# Patient Record
Sex: Male | Born: 1940 | Race: White | Hispanic: No | Marital: Single | Smoking: Current every day smoker
Health system: Southern US, Community
[De-identification: ages and names within clinical notes are randomized; demographics above are authoritative.]

## PROBLEM LIST (undated history)

## (undated) DIAGNOSIS — R29898 Other symptoms and signs involving the musculoskeletal system: Secondary | ICD-10-CM

## (undated) DIAGNOSIS — G629 Polyneuropathy, unspecified: Secondary | ICD-10-CM

## (undated) DIAGNOSIS — Z9852 Vasectomy status: Secondary | ICD-10-CM

## (undated) DIAGNOSIS — K449 Diaphragmatic hernia without obstruction or gangrene: Secondary | ICD-10-CM

## (undated) DIAGNOSIS — N179 Acute kidney failure, unspecified: Secondary | ICD-10-CM

## (undated) DIAGNOSIS — E46 Unspecified protein-calorie malnutrition: Secondary | ICD-10-CM

## (undated) DIAGNOSIS — I469 Cardiac arrest, cause unspecified: Secondary | ICD-10-CM

## (undated) DIAGNOSIS — R1310 Dysphagia, unspecified: Secondary | ICD-10-CM

## (undated) DIAGNOSIS — G931 Anoxic brain damage, not elsewhere classified: Secondary | ICD-10-CM

## (undated) DIAGNOSIS — R351 Nocturia: Secondary | ICD-10-CM

## (undated) DIAGNOSIS — J9690 Respiratory failure, unspecified, unspecified whether with hypoxia or hypercapnia: Secondary | ICD-10-CM

## (undated) DIAGNOSIS — Z8709 Personal history of other diseases of the respiratory system: Secondary | ICD-10-CM

## (undated) DIAGNOSIS — G4700 Insomnia, unspecified: Secondary | ICD-10-CM

## (undated) DIAGNOSIS — J189 Pneumonia, unspecified organism: Secondary | ICD-10-CM

## (undated) DIAGNOSIS — J439 Emphysema, unspecified: Secondary | ICD-10-CM

## (undated) DIAGNOSIS — Z8719 Personal history of other diseases of the digestive system: Secondary | ICD-10-CM

## (undated) DIAGNOSIS — I10 Essential (primary) hypertension: Secondary | ICD-10-CM

## (undated) DIAGNOSIS — R3911 Hesitancy of micturition: Secondary | ICD-10-CM

## (undated) HISTORY — PX: TONSILLECTOMY AND ADENOIDECTOMY: SUR1326

## (undated) HISTORY — PX: APPENDECTOMY: SHX54

## (undated) HISTORY — PX: COLONOSCOPY W/ BIOPSIES AND POLYPECTOMY: SHX1376

## (undated) HISTORY — PX: KNEE ARTHROSCOPY: SUR90

---

## 2014-04-29 HISTORY — PX: ESOPHAGEAL DILATION: SHX303

## 2016-12-26 ENCOUNTER — Inpatient Hospital Stay
Admission: AD | Admit: 2016-12-26 | Discharge: 2017-01-08 | Disposition: A | Discharge status: Rehabilitation Facility | Payer: Self-pay | Source: Ambulatory Visit | Attending: Internal Medicine | Admitting: Internal Medicine

## 2016-12-26 ENCOUNTER — Other Ambulatory Visit (HOSPITAL_COMMUNITY): Payer: Self-pay

## 2016-12-26 DIAGNOSIS — R4702 Dysphasia: Secondary | ICD-10-CM

## 2016-12-26 DIAGNOSIS — J189 Pneumonia, unspecified organism: Secondary | ICD-10-CM

## 2016-12-26 DIAGNOSIS — Z4659 Encounter for fitting and adjustment of other gastrointestinal appliance and device: Secondary | ICD-10-CM

## 2016-12-26 HISTORY — DX: Hesitancy of micturition: R39.11

## 2016-12-26 HISTORY — DX: Polyneuropathy, unspecified: G62.9

## 2016-12-26 HISTORY — DX: Essential (primary) hypertension: I10

## 2016-12-26 HISTORY — DX: Diaphragmatic hernia without obstruction or gangrene: K44.9

## 2016-12-26 HISTORY — DX: Other symptoms and signs involving the musculoskeletal system: R29.898

## 2016-12-26 HISTORY — DX: Personal history of other diseases of the digestive system: Z87.19

## 2016-12-26 HISTORY — DX: Nocturia: R35.1

## 2016-12-27 LAB — COMPREHENSIVE METABOLIC PANEL
ALBUMIN: 2.3 g/dL — AB (ref 3.5–5.0)
ALK PHOS: 94 U/L (ref 38–126)
ALT: 30 U/L (ref 17–63)
ANION GAP: 10 (ref 5–15)
AST: 29 U/L (ref 15–41)
BUN: 31 mg/dL — AB (ref 6–20)
CALCIUM: 8.7 mg/dL — AB (ref 8.9–10.3)
CO2: 29 mmol/L (ref 22–32)
CREATININE: 1.18 mg/dL (ref 0.61–1.24)
Chloride: 106 mmol/L (ref 101–111)
GFR calc Af Amer: >60 mL/min (ref >60)
GFR calc non Af Amer: 59 mL/min — ABNORMAL LOW (ref >60)
GLUCOSE: 146 mg/dL — AB (ref 65–99)
Potassium: 2.4 mmol/L — CL (ref 3.5–5.1)
SODIUM: 145 mmol/L (ref 135–145)
Total Bilirubin: 0.4 mg/dL (ref 0.3–1.2)
Total Protein: 6.2 g/dL — ABNORMAL LOW (ref 6.5–8.1)

## 2016-12-27 LAB — CBC WITH DIFFERENTIAL/PLATELET
BASOS PCT: 0 %
Basophils Absolute: 0 10*3/uL (ref 0.0–0.1)
EOS PCT: 4 %
Eosinophils Absolute: 0.4 10*3/uL (ref 0.0–0.7)
HEMATOCRIT: 25.2 % — AB (ref 39.0–52.0)
Hemoglobin: 7.9 g/dL — ABNORMAL LOW (ref 13.0–17.0)
Lymphocytes Relative: 13 %
Lymphs Abs: 1.2 10*3/uL (ref 0.7–4.0)
MCH: 30 pg (ref 26.0–34.0)
MCHC: 31.3 g/dL (ref 30.0–36.0)
MCV: 95.8 fL (ref 78.0–100.0)
MONO ABS: 0.9 10*3/uL (ref 0.1–1.0)
MONOS PCT: 9 %
NEUTROS ABS: 7.2 10*3/uL (ref 1.7–7.7)
Neutrophils Relative %: 74 %
PLATELETS: 540 10*3/uL — AB (ref 150–400)
RBC: 2.63 MIL/uL — ABNORMAL LOW (ref 4.22–5.81)
RDW: 14.3 % (ref 11.5–15.5)
WBC: 9.7 10*3/uL (ref 4.0–10.5)

## 2016-12-28 LAB — BASIC METABOLIC PANEL
ANION GAP: 10 (ref 5–15)
Anion gap: 9 (ref 5–15)
BUN: 40 mg/dL — ABNORMAL HIGH (ref 6–20)
BUN: 40 mg/dL — ABNORMAL HIGH (ref 6–20)
CALCIUM: 8.6 mg/dL — AB (ref 8.9–10.3)
CALCIUM: 8.8 mg/dL — AB (ref 8.9–10.3)
CO2: 30 mmol/L (ref 22–32)
CO2: 30 mmol/L (ref 22–32)
Chloride: 110 mmol/L (ref 101–111)
Chloride: 111 mmol/L (ref 101–111)
Creatinine, Ser: 1.48 mg/dL — ABNORMAL HIGH (ref 0.61–1.24)
Creatinine, Ser: 1.6 mg/dL — ABNORMAL HIGH (ref 0.61–1.24)
GFR, EST AFRICAN AMERICAN: 52 mL/min — AB (ref >60)
GFR, EST AFRICAN AMERICAN: 48 mL/min — AB (ref >60)
GFR, EST NON AFRICAN AMERICAN: 45 mL/min — AB (ref >60)
GFR, EST NON AFRICAN AMERICAN: 41 mL/min — AB (ref >60)
Glucose, Bld: 135 mg/dL — ABNORMAL HIGH (ref 65–99)
Glucose, Bld: 152 mg/dL — ABNORMAL HIGH (ref 65–99)
Potassium: 3.2 mmol/L — ABNORMAL LOW (ref 3.5–5.1)
Potassium: 3.7 mmol/L (ref 3.5–5.1)
Sodium: 150 mmol/L — ABNORMAL HIGH (ref 135–145)
Sodium: 150 mmol/L — ABNORMAL HIGH (ref 135–145)

## 2016-12-29 LAB — BASIC METABOLIC PANEL
Anion gap: 11 (ref 5–15)
BUN: 43 mg/dL — AB (ref 6–20)
CALCIUM: 8.6 mg/dL — AB (ref 8.9–10.3)
CO2: 31 mmol/L (ref 22–32)
CREATININE: 1.73 mg/dL — AB (ref 0.61–1.24)
Chloride: 112 mmol/L — ABNORMAL HIGH (ref 101–111)
GFR calc Af Amer: 43 mL/min — ABNORMAL LOW (ref >60)
GFR, EST NON AFRICAN AMERICAN: 37 mL/min — AB (ref >60)
GLUCOSE: 109 mg/dL — AB (ref 65–99)
Potassium: 4 mmol/L (ref 3.5–5.1)
Sodium: 154 mmol/L — ABNORMAL HIGH (ref 135–145)

## 2016-12-29 LAB — POTASSIUM: Potassium: 3.5 mmol/L (ref 3.5–5.1)

## 2016-12-30 LAB — BASIC METABOLIC PANEL
Anion gap: 9 (ref 5–15)
BUN: 39 mg/dL — ABNORMAL HIGH (ref 6–20)
CALCIUM: 8.4 mg/dL — AB (ref 8.9–10.3)
CO2: 30 mmol/L (ref 22–32)
Chloride: 111 mmol/L (ref 101–111)
Creatinine, Ser: 1.67 mg/dL — ABNORMAL HIGH (ref 0.61–1.24)
GFR, EST AFRICAN AMERICAN: 45 mL/min — AB (ref >60)
GFR, EST NON AFRICAN AMERICAN: 39 mL/min — AB (ref >60)
GLUCOSE: 138 mg/dL — AB (ref 65–99)
POTASSIUM: 3.6 mmol/L (ref 3.5–5.1)
Sodium: 150 mmol/L — ABNORMAL HIGH (ref 135–145)

## 2016-12-31 LAB — BASIC METABOLIC PANEL
ANION GAP: 7 (ref 5–15)
BUN: 28 mg/dL — AB (ref 6–20)
CHLORIDE: 103 mmol/L (ref 101–111)
CO2: 30 mmol/L (ref 22–32)
Calcium: 8.2 mg/dL — ABNORMAL LOW (ref 8.9–10.3)
Creatinine, Ser: 1.3 mg/dL — ABNORMAL HIGH (ref 0.61–1.24)
GFR calc Af Amer: >60 mL/min (ref >60)
GFR calc non Af Amer: 53 mL/min — ABNORMAL LOW (ref >60)
Glucose, Bld: 136 mg/dL — ABNORMAL HIGH (ref 65–99)
POTASSIUM: 2.9 mmol/L — AB (ref 3.5–5.1)
SODIUM: 140 mmol/L (ref 135–145)

## 2016-12-31 LAB — C DIFFICILE QUICK SCREEN W PCR REFLEX
C DIFFICILE (CDIFF) INTERP: NOT DETECTED
C DIFFICILE (CDIFF) TOXIN: NEGATIVE
C DIFFICLE (CDIFF) ANTIGEN: NEGATIVE

## 2017-01-01 LAB — POTASSIUM: Potassium: 3.3 mmol/L — ABNORMAL LOW (ref 3.5–5.1)

## 2017-01-02 ENCOUNTER — Other Ambulatory Visit (HOSPITAL_COMMUNITY): Payer: Self-pay

## 2017-01-02 LAB — POTASSIUM: POTASSIUM: 3.2 mmol/L — AB (ref 3.5–5.1)

## 2017-01-03 LAB — POTASSIUM: POTASSIUM: 3.1 mmol/L — AB (ref 3.5–5.1)

## 2017-01-04 LAB — POTASSIUM: POTASSIUM: 3.3 mmol/L — AB (ref 3.5–5.1)

## 2017-01-05 LAB — C DIFFICILE QUICK SCREEN W PCR REFLEX
C DIFFICLE (CDIFF) ANTIGEN: NEGATIVE
C Diff interpretation: NOT DETECTED
C Diff toxin: NEGATIVE

## 2017-01-05 LAB — POTASSIUM: POTASSIUM: 3.8 mmol/L (ref 3.5–5.1)

## 2017-01-08 ENCOUNTER — Encounter (HOSPITAL_COMMUNITY): Payer: Self-pay | Admitting: *Deleted

## 2017-01-08 ENCOUNTER — Encounter: Payer: Self-pay | Admitting: Physical Medicine and Rehabilitation

## 2017-01-08 ENCOUNTER — Inpatient Hospital Stay (HOSPITAL_COMMUNITY)
Admission: RE | Admit: 2017-01-08 | Discharge: 2017-01-16 | DRG: 057 | Disposition: A | Discharge status: Home Health | Payer: Medicare Other | Source: Intra-hospital | Attending: Physical Medicine & Rehabilitation | Admitting: Physical Medicine & Rehabilitation

## 2017-01-08 DIAGNOSIS — F1010 Alcohol abuse, uncomplicated: Secondary | ICD-10-CM

## 2017-01-08 DIAGNOSIS — E46 Unspecified protein-calorie malnutrition: Secondary | ICD-10-CM

## 2017-01-08 DIAGNOSIS — Z09 Encounter for follow-up examination after completed treatment for conditions other than malignant neoplasm: Secondary | ICD-10-CM

## 2017-01-08 DIAGNOSIS — R310 Gross hematuria: Secondary | ICD-10-CM | POA: Diagnosis not present

## 2017-01-08 DIAGNOSIS — N390 Urinary tract infection, site not specified: Secondary | ICD-10-CM | POA: Diagnosis not present

## 2017-01-08 DIAGNOSIS — R1310 Dysphagia, unspecified: Secondary | ICD-10-CM | POA: Diagnosis present

## 2017-01-08 DIAGNOSIS — Z79899 Other long term (current) drug therapy: Secondary | ICD-10-CM

## 2017-01-08 DIAGNOSIS — I10 Essential (primary) hypertension: Secondary | ICD-10-CM | POA: Diagnosis present

## 2017-01-08 DIAGNOSIS — Z91038 Other insect allergy status: Secondary | ICD-10-CM

## 2017-01-08 DIAGNOSIS — F17210 Nicotine dependence, cigarettes, uncomplicated: Secondary | ICD-10-CM | POA: Diagnosis present

## 2017-01-08 DIAGNOSIS — I69391 Dysphagia following cerebral infarction: Secondary | ICD-10-CM | POA: Diagnosis not present

## 2017-01-08 DIAGNOSIS — D638 Anemia in other chronic diseases classified elsewhere: Secondary | ICD-10-CM

## 2017-01-08 DIAGNOSIS — S3730XA Unspecified injury of urethra, initial encounter: Secondary | ICD-10-CM | POA: Diagnosis not present

## 2017-01-08 DIAGNOSIS — N179 Acute kidney failure, unspecified: Secondary | ICD-10-CM | POA: Diagnosis present

## 2017-01-08 DIAGNOSIS — R5381 Other malaise: Secondary | ICD-10-CM | POA: Diagnosis present

## 2017-01-08 DIAGNOSIS — J869 Pyothorax without fistula: Secondary | ICD-10-CM

## 2017-01-08 DIAGNOSIS — X58XXXA Exposure to other specified factors, initial encounter: Secondary | ICD-10-CM | POA: Diagnosis present

## 2017-01-08 DIAGNOSIS — G4700 Insomnia, unspecified: Secondary | ICD-10-CM | POA: Diagnosis present

## 2017-01-08 DIAGNOSIS — R339 Retention of urine, unspecified: Secondary | ICD-10-CM | POA: Diagnosis present

## 2017-01-08 DIAGNOSIS — G629 Polyneuropathy, unspecified: Secondary | ICD-10-CM | POA: Diagnosis present

## 2017-01-08 DIAGNOSIS — Z1621 Resistance to vancomycin: Secondary | ICD-10-CM | POA: Diagnosis present

## 2017-01-08 DIAGNOSIS — I6389 Other cerebral infarction: Secondary | ICD-10-CM | POA: Diagnosis present

## 2017-01-08 DIAGNOSIS — Z720 Tobacco use: Secondary | ICD-10-CM | POA: Diagnosis not present

## 2017-01-08 DIAGNOSIS — J90 Pleural effusion, not elsewhere classified: Secondary | ICD-10-CM | POA: Diagnosis present

## 2017-01-08 DIAGNOSIS — I638 Other cerebral infarction: Secondary | ICD-10-CM

## 2017-01-08 DIAGNOSIS — R319 Hematuria, unspecified: Secondary | ICD-10-CM | POA: Diagnosis present

## 2017-01-08 DIAGNOSIS — E440 Moderate protein-calorie malnutrition: Secondary | ICD-10-CM | POA: Diagnosis present

## 2017-01-08 DIAGNOSIS — I69351 Hemiplegia and hemiparesis following cerebral infarction affecting right dominant side: Secondary | ICD-10-CM | POA: Diagnosis not present

## 2017-01-08 DIAGNOSIS — Z8674 Personal history of sudden cardiac arrest: Secondary | ICD-10-CM

## 2017-01-08 DIAGNOSIS — K449 Diaphragmatic hernia without obstruction or gangrene: Secondary | ICD-10-CM | POA: Diagnosis present

## 2017-01-08 DIAGNOSIS — E8809 Other disorders of plasma-protein metabolism, not elsewhere classified: Secondary | ICD-10-CM

## 2017-01-08 DIAGNOSIS — Z91030 Bee allergy status: Secondary | ICD-10-CM | POA: Diagnosis not present

## 2017-01-08 DIAGNOSIS — B952 Enterococcus as the cause of diseases classified elsewhere: Secondary | ICD-10-CM | POA: Diagnosis not present

## 2017-01-08 DIAGNOSIS — Z6822 Body mass index (BMI) 22.0-22.9, adult: Secondary | ICD-10-CM

## 2017-01-08 HISTORY — DX: Dysphagia, unspecified: R13.10

## 2017-01-08 HISTORY — DX: Respiratory failure, unspecified, unspecified whether with hypoxia or hypercapnia: J96.90

## 2017-01-08 HISTORY — DX: Personal history of other diseases of the respiratory system: Z87.09

## 2017-01-08 HISTORY — DX: Anoxic brain damage, not elsewhere classified: G93.1

## 2017-01-08 HISTORY — DX: Emphysema, unspecified: J43.9

## 2017-01-08 HISTORY — DX: Cardiac arrest, cause unspecified: I46.9

## 2017-01-08 HISTORY — DX: Pneumonia, unspecified organism: J18.9

## 2017-01-08 HISTORY — DX: Insomnia, unspecified: G47.00

## 2017-01-08 HISTORY — DX: Unspecified protein-calorie malnutrition: E46

## 2017-01-08 HISTORY — DX: Vasectomy status: Z98.52

## 2017-01-08 HISTORY — DX: Acute kidney failure, unspecified: N17.9

## 2017-01-08 LAB — URINALYSIS, COMPLETE (UACMP) WITH MICROSCOPIC
Bilirubin Urine: NEGATIVE
Glucose, UA: NEGATIVE mg/dL
Ketones, ur: NEGATIVE mg/dL
LEUKOCYTES UA: NEGATIVE
NITRITE: NEGATIVE
Protein, ur: 30 mg/dL — AB
SPECIFIC GRAVITY, URINE: 1.014 (ref 1.005–1.030)
SQUAMOUS EPITHELIAL / LPF: NONE SEEN
pH: 5 (ref 5.0–8.0)

## 2017-01-08 MED ORDER — GABAPENTIN 100 MG PO CAPS
100.0000 mg | ORAL_CAPSULE | Freq: Every day | ORAL | Status: DC
  Administered 2017-01-08 – 2017-01-15 (×8): 100 mg via ORAL
  Filled 2017-01-08 (×8): qty 1

## 2017-01-08 MED ORDER — POLYETHYLENE GLYCOL 3350 17 G PO PACK: 17.0000 g | PACK | Freq: Every day | ORAL | Status: DC | PRN

## 2017-01-08 MED ORDER — PROCHLORPERAZINE 25 MG RE SUPP: 12.5000 mg | Freq: Four times a day (QID) | RECTAL | Status: DC | PRN

## 2017-01-08 MED ORDER — TRAZODONE HCL 50 MG PO TABS
25.0000 mg | ORAL_TABLET | Freq: Every evening | ORAL | Status: DC | PRN
  Administered 2017-01-08: 50 mg via ORAL
  Filled 2017-01-08: qty 1

## 2017-01-08 MED ORDER — ALUM & MAG HYDROXIDE-SIMETH 200-200-20 MG/5ML PO SUSP: 30.0000 mL | ORAL | Status: DC | PRN

## 2017-01-08 MED ORDER — TAMSULOSIN HCL 0.4 MG PO CAPS
0.4000 mg | ORAL_CAPSULE | Freq: Every day | ORAL | Status: DC
  Administered 2017-01-08 – 2017-01-15 (×8): 0.4 mg via ORAL
  Filled 2017-01-08 (×8): qty 1

## 2017-01-08 MED ORDER — FOLIC ACID 1 MG PO TABS
1.0000 mg | ORAL_TABLET | Freq: Every day | ORAL | Status: DC
  Administered 2017-01-09 – 2017-01-16 (×8): 1 mg via ORAL
  Filled 2017-01-08 (×8): qty 1

## 2017-01-08 MED ORDER — PROCHLORPERAZINE MALEATE 5 MG PO TABS: 5.0000 mg | ORAL_TABLET | Freq: Four times a day (QID) | ORAL | Status: DC | PRN

## 2017-01-08 MED ORDER — PANTOPRAZOLE SODIUM 40 MG PO TBEC
40.0000 mg | DELAYED_RELEASE_TABLET | Freq: Every day | ORAL | Status: DC
  Administered 2017-01-09 – 2017-01-16 (×8): 40 mg via ORAL
  Filled 2017-01-08 (×8): qty 1

## 2017-01-08 MED ORDER — FUROSEMIDE 40 MG PO TABS
40.0000 mg | ORAL_TABLET | Freq: Every day | ORAL | Status: DC
  Administered 2017-01-09 – 2017-01-10 (×2): 40 mg via ORAL
  Filled 2017-01-08 (×2): qty 1

## 2017-01-08 MED ORDER — ALBUTEROL SULFATE (2.5 MG/3ML) 0.083% IN NEBU: 2.5000 mg | INHALATION_SOLUTION | RESPIRATORY_TRACT | Status: DC | PRN

## 2017-01-08 MED ORDER — BISACODYL 10 MG RE SUPP: 10.0000 mg | Freq: Every day | RECTAL | Status: DC | PRN

## 2017-01-08 MED ORDER — FLEET ENEMA 7-19 GM/118ML RE ENEM: 1.0000 | ENEMA | Freq: Once | RECTAL | Status: DC | PRN

## 2017-01-08 MED ORDER — METOPROLOL TARTRATE 50 MG PO TABS
50.0000 mg | ORAL_TABLET | Freq: Two times a day (BID) | ORAL | Status: DC
  Administered 2017-01-08 – 2017-01-10 (×4): 50 mg via ORAL
  Filled 2017-01-08 (×4): qty 1

## 2017-01-08 MED ORDER — GUAIFENESIN-DM 100-10 MG/5ML PO SYRP: 5.0000 mL | ORAL_SOLUTION | Freq: Four times a day (QID) | ORAL | Status: DC | PRN

## 2017-01-08 MED ORDER — NICOTINE 14 MG/24HR TD PT24
14.0000 mg | MEDICATED_PATCH | Freq: Every day | TRANSDERMAL | Status: DC
  Administered 2017-01-09 – 2017-01-16 (×8): 14 mg via TRANSDERMAL
  Filled 2017-01-08 (×8): qty 1

## 2017-01-08 MED ORDER — ENOXAPARIN SODIUM 40 MG/0.4ML ~~LOC~~ SOLN
40.0000 mg | SUBCUTANEOUS | Status: DC
  Administered 2017-01-08 – 2017-01-15 (×8): 40 mg via SUBCUTANEOUS
  Filled 2017-01-08 (×8): qty 0.4

## 2017-01-08 MED ORDER — PROCHLORPERAZINE EDISYLATE 5 MG/ML IJ SOLN: 5.0000 mg | Freq: Four times a day (QID) | INTRAMUSCULAR | Status: DC | PRN

## 2017-01-08 MED ORDER — LIDOCAINE HCL 2 % EX GEL
1.0000 "application " | CUTANEOUS | Status: DC | PRN
  Filled 2017-01-08 (×2): qty 5

## 2017-01-08 MED ORDER — ACETAMINOPHEN 325 MG PO TABS
325.0000 mg | ORAL_TABLET | ORAL | Status: DC | PRN
  Administered 2017-01-08 – 2017-01-16 (×11): 650 mg via ORAL
  Filled 2017-01-08 (×12): qty 2

## 2017-01-08 MED ORDER — DIPHENHYDRAMINE HCL 12.5 MG/5ML PO ELIX: 12.5000 mg | ORAL_SOLUTION | Freq: Four times a day (QID) | ORAL | Status: DC | PRN

## 2017-01-08 MED ORDER — POTASSIUM CHLORIDE CRYS ER 20 MEQ PO TBCR
20.0000 meq | EXTENDED_RELEASE_TABLET | Freq: Every day | ORAL | Status: DC
  Administered 2017-01-09 – 2017-01-16 (×8): 20 meq via ORAL
  Filled 2017-01-08 (×8): qty 1

## 2017-01-08 MED ORDER — VITAMIN B-1 100 MG PO TABS
100.0000 mg | ORAL_TABLET | Freq: Every day | ORAL | Status: DC
  Administered 2017-01-09 – 2017-01-16 (×8): 100 mg via ORAL
  Filled 2017-01-08 (×8): qty 1

## 2017-01-08 NOTE — Progress Notes (Signed)
Pt arrived to room 4w09 accompanied by daughter. Oriented x 4, VSS, denies pain.  Oriented to room and rehab process/ Report to night shift RN.

## 2017-01-08 NOTE — H&P (Signed)
Physical Medicine and Rehabilitation Admission H&P    CC: Debility    HPI:  Jackson Howell is a 76 year old male with history of HTN, peripheral neuropathy, dysphagia s/p esophageal dilatation, ETOH abuse, recent PNA who was admitted to [facility] on 12/08/16 with low grade fever/chills, fatigue, multiple episodes of emesis and SOB due to empyema. Hospital course there complicated by septic shock with PEA arrest 12/10/16 with CPR X 17 minutes. Chest tube placed with drainage of 2 L of purulent material and he was treated with Zosyn thorough 9/8. Noted to have severe dysphagia with NPO, aphasia and lethargy/encephalopathic--EEG negative for seizures. MRI brai n done showing subacute infarct left corona radiata and possible right caudate nucleus.  He was transferred to [facility] on 12/26/16 and has been making steady progress. Lethargy has resolved, respiratory status improved and has been started on dysphagia 2, nectar liquids. Esophagogram negative for stricture and no aspiration noted. Foley placed due to urinary retention.  He continues to be debilitated with decline in mobility and ability to carryout ADL tasks. CIR recommended by rehab team.    Review of Systems  Constitutional: Negative for chills and fever.  HENT: Negative for hearing loss and tinnitus.   Eyes: Negative for blurred vision and double vision.  Respiratory: Negative for cough and shortness of breath.   Cardiovascular: Negative for chest pain and palpitations.  Gastrointestinal: Negative for abdominal pain, heartburn and nausea.  Genitourinary: Positive for frequency and urgency. Negative for dysuria.  Musculoskeletal: Negative for joint pain and myalgias.  Skin: Negative for itching and rash.  Neurological: Positive for sensory change (bilateral feet) and weakness.  Psychiatric/Behavioral: Negative for depression. The patient is not nervous/anxious.   All other systems reviewed and are negative.   Past Medical  History:  Diagnosis Date  . Hiatal hernia   . History of esophageal stricture    s/p dilatation X 2  . HTN (hypertension)   . Peripheral neuropathy    bilateral feet  . RUE weakness      Past Surgical History:  Procedure Laterality Date  . APPENDECTOMY    . COLONOSCOPY W/ BIOPSIES AND POLYPECTOMY    . ESOPHAGEAL DILATION  2016  . KNEE ARTHROSCOPY Left   . TONSILLECTOMY AND ADENOIDECTOMY       Family History  Problem Relation Age of Onset  . Diabetes Mother   . ALS Father   . Prostate cancer Brother      Social History:  [place]ives in [place]--has daughter in [place]. Divorced but sees wife daily. Used to play tennis but has been sedentary for past 10 years.  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 he has been smoking 2 PPD. Drinks couple of glasses of wine and 2 shots of liquor daily. Does not use any illicit drugs.      Allergies  Allergen Reactions  . Bee Venom   . Wasp Venom      Home Medications: Gabapentin 600 mg bid. Losartan Amlodipine.    Drug Regimen Review  Drug regimen was reviewed and remains appropriate with no significant issues identified  Home: Home Living Living Arrangements: Alone Available Help at Discharge: Family, Available PRN/intermittently Type of Home: House Home Access: Stairs to enter [employer] of Steps: 22 steps at back entrance Home Layout: Two level, Laundry or work area in basement, Able to live on main level with bedroom/bathroom Alternate Level Stairs-Number of Steps: 22 steps  Lives With: Alone   Functional History: Prior  Function Level of Independence: Independent  Functional Status:  Mobility: Min assist for transfers. CGA to ambulate 25' with RW     ADL:    Cognition:        Height 6' (1.829 m), weight 75.8 kg (167 lb 1.7 oz). Physical Exam  Nursing note and vitals reviewed. Constitutional: He is oriented to person, place, and time. He appears well-developed and well-nourished.  HENT:   Head: Normocephalic and atraumatic.  Mouth/Throat: Oropharynx is clear and moist.  Eyes: Pupils are equal, round, and reactive to light. Conjunctivae and EOM are normal.  Neck: Normal range of motion. Neck supple.  Cardiovascular: Normal rate and regular rhythm.   No murmur heard. Respiratory: Effort normal and breath sounds normal. No stridor. No respiratory distress. He has no wheezes.  GI: Soft. Bowel sounds are normal. He exhibits no distension. There is no tenderness.  Genitourinary:  Genitourinary Comments: Foley in place.   Musculoskeletal: He exhibits no edema or tenderness.  Right shoulder limited due to RTC pathology.   Neurological: He is alert and oriented to person, place, and time.  Motor: RUE: Shoulder abduction 3-/5, distally 4/5 (rotator cuff pathology) RLE: 4/5 proximal to distal LUE/LLE: 4+/5 proximal to distal  Skin: Skin is warm and dry.  Resolving ecchymosis bilateral forearms   Psychiatric: He has a normal mood and affect. His behavior is normal. Thought content normal.    No results found for this or any previous visit (from the past 48 hour(s)). No results found.     Medical Problem List and Plan: 1.  Decreased endurance, mobility deficits, and limitation in self-care secondary to debility.  2.  DVT Prophylaxis/Anticoagulation: Pharmaceutical: Lovenox 3. Pain Management: tylenol prn  4. Mood: LCSW to follow for evaluation and support.  5. Neuropsych: This patient is capable of making decisions on his own behalf. 6. Skin/Wound Care: Routine pressure relief measures 7. Fluids/Electrolytes/Nutrition: Monitor I/O. Used to drink boost at home. Check lytes in am.  8. HTN: Monitor BP bid--continue metoprolol bid. May need to hold lasix for now. Resume home medications as appropriate. 9. Dysphagia: On dysphagia 2, nectar liquids with poor intake due to food choices. No recent labs--question ability to maintain adequate hydration.  10. Alcohol abuse: continue  thiamine and folic acid. 11. Tobacco abuse: Continue nicotine patch.  12. Peripheral neuropathy: Will resume low dose gabapentin at bedtime to help with insomnia. Monitor for symptoms as activity increases.  13. Urinary retention: Check UA/UCS. Remove foley in am and start bladder training.  14. Code status: Discussed-- Full Code.  15. Acute kidney injury: Encourage fluid intake--may need IVF for hydration.  16. Anemia of chronic illness?: Last H/H 7.9/25.2. Recheck in am.    Post Admission Physician Evaluation: 1. Preadmission assessment reviewed and changes made below. 2. Functional deficits secondary  to debility. 3. Patient is admitted to receive collaborative, interdisciplinary care between the physiatrist, rehab nursing staff, and therapy team. 4. Patient's level of medical complexity and substantial therapy needs in context of that medical necessity cannot be provided at a lesser intensity of care such as a SNF. 5. Patient has experienced substantial functional loss from his/her baseline which was documented above under the "Functional History" and "Functional Status" headings.  Judging by the patient's diagnosis, physical exam, and functional history, the patient has potential for functional progress which will result in measurable gains while on inpatient rehab.  These gains will be of substantial and practical use upon discharge  in facilitating mobility and self-care at the  household level. 6. Physiatrist will provide 24 hour management of medical needs as well as oversight of the therapy plan/treatment and provide guidance as appropriate regarding the interaction of the two. 7. 24 hour rehab nursing will assist with bladder management, safety, disease management, medication administration and patient education and help integrate therapy concepts, techniques,education, etc. 8. PT will assess and treat for/with: Lower extremity strength, range of motion, stamina, balance, functional  mobility, safety, adaptive techniques and equipment, woundcare, coping skills, pain control, education.   Goals are: Mod I. 9. OT will assess and treat for/with: ADL's, functional mobility, safety, upper extremity strength, adaptive techniques and equipment, wound mgt, ego support, and community reintegration.   Goals are: Mod I/Supervision. Therapy may proceed with showering this patient. 10. SLP will assess and treat for/with: speech, cognition.  Goals are: Mod I. 11. Case Management and Social Worker will assess and treat for psychological issues and discharge planning. 12. Team conference will be held weekly to assess progress toward goals and to determine barriers to discharge. 13. Patient will receive at least 3 hours of therapy per day at least 5 days per week. 14. ELOS: 9-13 days.       15. Prognosis:  good   Maryla MorrowAnkit Cregg Jutte, MD, 8926 Holly DriveFAAPMR Love, Pamela S, [place]PA-C 01/08/2017

## 2017-01-08 NOTE — IPOC Note (Signed)
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 [facility])   Benign essential HTN   Dysphagia   ETOH abuse   Tobacco abuse   Peripheral polyneuropathy   Urinary retention   AKI (acute kidney injury) (HCC)   Anemia of chronic disease   Hypoalbuminemia due to protein-calorie malnutrition (HCC)   Bilateral pleural effusion     Functional Problem List: Nursing Nutrition, Endurance, Motor, Bladder  PT Balance, Endurance, Motor  OT Balance, Endurance, Motor, Safety  SLP Cognition  TR         Basic ADL's: OT Grooming, Bathing, Dressing, Toileting     Advanced  ADL's: OT Simple Meal Preparation, Laundry     Transfers: PT Bed Mobility, Bed to Chair, Car, Occupational psychologist, Research scientist (life sciences): PT Ambulation, Educational psychologist, Psychologist, prison and probation services     Additional Impairments: OT None  SLP Swallowing      TR      Anticipated Outcomes Item Anticipated Outcome  Self Feeding No goal  Swallowing  mod I    Basic self-care  Mod I  Toileting  Mod I   Bathroom Transfers Mod I  Bowel/Bladder  Patient is continent of bowel, has Foley catheter to be removed this am, anticipate patient to be continent of bowel and bladder  Transfers  mod I  Locomotion  mod I  Communication     Cognition     Pain  C/O back pain scale of 3/10, anticipate pain less than 3  Safety/Judgment  No safety issues noted, anticipate no fall or injury this admission at rehab.   Therapy Plan: PT Intensity: Minimum of 1-2 x/day ,45 to 90 minutes PT Frequency: 5 out of 7 days PT Duration Estimated Length of Stay: 9-13 days OT Intensity: Minimum of 1-2 x/day, 45 to 90 minutes OT Frequency: 5 out of 7 days OT Duration/Estimated Length of Stay: 9-13 days SLP Intensity: Minumum of 1-2 x/day,  30 to 90 minutes SLP Frequency: 3 to 5 out of 7 days SLP Duration/Estimated Length of Stay: 10-14 days     Team Interventions: Nursing Interventions Pain Management, Bladder Management, Bowel Management, Patient/Family Education, Skin Care/Wound Management, Medication Management, Dysphagia/Aspiration Precaution Training, Psychosocial Support, Discharge Planning, Disease Management/Prevention, Cognitive Remediation/Compensation  PT interventions Ambulation/gait training, Warden/ranger, Cognitive remediation/compensation, Community reintegration, Discharge planning, DME/adaptive equipment instruction, Functional mobility training, Neuromuscular re-education, Pain management, Patient/family education, Splinting/orthotics, Stair training, Therapeutic Activities, Therapeutic Exercise, UE/LE Strength taining/ROM, UE/LE Coordination activities, Wheelchair propulsion/positioning  OT Interventions Warden/ranger, Firefighter, Discharge planning, Disease mangement/prevention, Fish farm manager, Functional mobility training, Neuromuscular re-education, Pain management, Patient/family education, Psychosocial support, Self Care/advanced ADL retraining, Therapeutic Activities, Therapeutic Exercise, Visual/perceptual remediation/compensation  SLP Interventions Cueing hierarchy, Dysphagia/aspiration precaution training, Patient/family education  TR Interventions    SW/CM Interventions Discharge Planning, Psychosocial Support, Patient/Family Education   Barriers to Discharge MD  Medical stability  Nursing Decreased caregiver support, Medical stability, Home environment access/layout, Medication compliance, Nutrition means    PT Decreased caregiver support pt will be home alone  OT Lack of/limited family support    SLP      SW       Team Discharge Planning: Destination: PT-Home ,OT- Home , SLP-Home Projected Follow-up: PT-Home health PT, OT-   Outpatient OT, SLP-Other (comment) (TBD) Projected Equipment Needs: PT-To be determined, OT- Tub/shower bench,  3 in 1 bedside comode, SLP-To be determined Equipment Details: PT- , OT-  Patient/family involved in discharge planning: PT- Patient,  OT-Patient, SLP-Patient  MD ELOS: 9-13 days. Medical Rehab Prognosis:  Good Assessment: 76 year old male with history of HTN, peripheral neuropathy, dysphagia s/p esophageal dilatation, ETOH abuse, recent PNA [facility] with low grade fever/chills, fatigue, multiple episodes of emesis and SOB due to empyema. Hospital course complicated by septic shock with PEA arrest 12/10/16 with CPR X 17 minutes. Chest tube placed with drainage of 2 L of purulent material and he was treated with Zosyn thorough 9/8. Noted to have severe dysphagia with NPO, aphasia and lethargy/encephalopathic--EEG negative for seizures. MRI brai n done showing subacute infarct left corona radiata and possible right caudate nucleus.  [facility] and has been making steady progress. Lethargy has resolved, respiratory status improved and has been started on dysphagia 2, nectar liquids. Esophagogram negative for stricture and no aspiration noted. Foley placed due to urinary retention.  He continues to be debilitated with decline in mobility and ability to carryout ADL tasks. Will set goals for Mod I with PT/OT/SLP.   See Team Conference Notes for weekly updates to the plan of care

## 2017-01-08 NOTE — PMR Pre-admission (Signed)
[place] PMR Admission Coordinator Pre-Admission Assessment  Patient: Jackson Howell is an 76 y.o., male MRN: [id number] DOB: 06-12-1940 Height: 6' (182.9 cm) Weight: 75.8 kg (167 lb 1.7 oz)  Insurance Information HMO: No   PPO:       PCP:       IPA:       80/20:       OTHER:   PRIMARY:  Medicare A/B      Policy#: [id number]      Subscriber: [name] CM Name:        Phone#:       Fax#:   Pre-Cert#:        Employer:  Retired Benefits:  Phone #:       Name: Checked in [place] one source [place]. Date: 01/28/08     Deduct: $1340      Out of Pocket Max: none      Life Max: N/A CIR: 100%      SNF: 100 days Outpatient: 80%     Co-Pay: 20% Home Health: 100%      Co-Pay: none DME: 80%     Co-Pay: 20% Providers: patient's choice  SECONDARY: BCBS      Policy#: [id number]      Subscriber:  [name] CM Name:        Phone#:       Fax#:   Pre-Cert#:        Employer: Retired Benefits:  Phone #: [phone]     Name:   Eff. Date:       Deduct:        Out of Pocket Max:        Life Max:   CIR:        SNF:   Outpatient:       Co-Pay:   Home Health:        Co-Pay:   DME:       Co-Pay:    Emergency Contact Information Contact Information    Name Relation Home Work Mobile   [name] Sister [phone]     [name] [phone]        Current Medical History  Patient Admitting Diagnosis:  Acute encephalopathy, watershed infarct  History of Present Illness: A 76 yo male was admitted to [facility] on 12/26/16 after being treated at [facility].  He had dysphagia, anoxic brain injury, hypoxic encephalopathy.  He had recently been treated for pneumonia and presented to [facility] with complaints of worsening chest pain and left flank pain.  The patient developed septic shock secondary to parapneumonic empyema.  He had chest tubes placed 12/10/16 with 2 liters of purulent fluid and was started on antibiotics.  Chest tubes were  removed on 12/19/16.  While at [facility], the patient developed respiratory failure and MRI revealed a watershed infarct.  Palliative care was consulted and the patient was made a DNR.  On [facility], he has progressed with diet to dysphagia 2, nectar thick liquids and is on a fluid restriction of 1500 mL/day.  He has been receiving and participating with PT/OT/SLP and is progressing well.  Recommendations from [facility] were for inpatient rehab admission.  Patient to be admitted today for acute inpatient rehab program.    Patient's medical record from [facility] has been reviewed by the rehabilitation admission coordinator and physician.  Past Medical History  Hypertension, alcohol abuse, nicotine abuse, hiatal hernia, hyperlipidemia, peripheral neuropathy  Family History   No pertinent family history of  shock.  Prior Rehab/Hospitalizations Has the patient had major surgery during 100 days prior to admission? No   Current Medications See MAR from [facility]  Patients Current Diet:  Dysphagia 2, nectar thick liquids, fluid restrict 1500 mL/day  Precautions / Restrictions Precautions Precautions: Fall Precautions/Special Needs: Swallowing   Has the patient had 2 or more falls or a fall with injury in the past year?No  Prior Activity Level Community (5-7x/wk): Went out daily.  Was driving.  Prior Functional Level Self Care: Did the patient need help bathing, dressing, using the toilet or eating?  Independent  Indoor Mobility: Did the patient need assistance with walking from room to room (with or without device)? Independent  Stairs: Did the patient need assistance with internal or external stairs (with or without device)? Independent  Functional Cognition: Did the patient need help planning regular tasks such as shopping or remembering to take medications? Independent  Home Assistive Devices / Equipment Home Assistive Devices/Equipment: None  Prior Device  Use: Indicate devices/aids used by the patient prior to current illness, exacerbation or injury? None   Prior Functional Level Current Functional Level  Bed Mobility  Independent  Min assist   Transfers  Independent  Min assist   Mobility - Walk/Wheelchair  Independent  Min assist (Ambulated 15 feet RW.)   Upper Body Dressing  Independent  Min assist   Lower Body Dressing  Independent  Mod assist   Grooming  Independent  Other (Set up)   Eating/Drinking  Independent  Other (Set up)   Toilet Transfer  Independent  Min assist   Bladder Continence   WDL  Condom catheter   Bowel Management  WDL  WDL  Stair Climbing  Not tried Other (Not tried.)   Communication  Verbal and intact  Verbal, intact   Memory  WDL  Decreased STM, impaired   Cooking/Meal Prep  Independent      Housework  Independent    Money Management  Independent    Driving  Independent     Special needs/care consideration BiPAP/CPAP No CPM No Continuous Drip IV No Dialysis No        Life Vest No Oxygen no Special Bed No Trach Size No Wound Vac (area) No   Skin No                            Bowel mgmt: WDL Bladder mgmt: Condom catheter Diabetic mgmt No  Previous Home Environment Living Arrangements: Alone  Lives With: Alone Available Help at Discharge: Family, Available PRN/intermittently Type of Home: House Home Layout: Two level, Laundry or work area in basement, Able to live on main level with bedroom/bathroom Alternate Level Stairs-Number of Steps: 22 steps Home Access: Stairs to enter [employer] of Steps: 22 steps at back entrance  Discharge Living Setting Plans for Discharge Living Setting: House, Lives with (comment) (Plans home with daughter short term.) Type of Home at Discharge: House Discharge Home Layout: Two level, Bed/bath upstairs, Able to live on main level with bedroom/bathroom (Guest bedroom on main level for patient.) Alternate Level  Stairs-Number of Steps: Flight Discharge Home Access: Stairs to enter [employer] brewer of Steps: 2 step entry  [place] Patient Roles: Parent, Other (Comment) (Has a daughter, son and a lady friend.) Contact Information: Cyndia Diver - daughter - [phone] Anticipated Caregiver: Dtr, son-in-law Ability/Limitations of Caregiver: Dtr and son-in-law work during the day Caregiver Availability: Intermittent Discharge Plan Discussed with Primary Caregiver:  Yes Is Caregiver In Agreement with Plan?: Yes Does Caregiver/Family have Issues with Lodging/Transportation while Pt is in Rehab?: No  Goals/Additional Needs Patient/Family Goal for Rehab: PT/OT/SLP mod I and supervision goals Expected length of stay: 10-12 days Cultural Considerations: Hosie Poisson Dietary Needs: Dys 2, nectar thick liquids, 1500 ML fluid restriction Equipment Needs: TBD Pt/Family Agrees to Admission and willing to participate: Yes Program Orientation Provided & Reviewed with Pt/Caregiver Including Roles  & Responsibilities: Yes  Patient Condition: I met with patient and I spoke with his daughter by phone.  Patient his made significant progress in the past week and is now ready for intense therapy with goal to return home with daughter.  Daughter would like patient to be able to get around, ambulate and take care of his basic needs prior to coming home to her house short term.  Patient is tolerating up in chair and is participating actively with PT/OT/SLP therapies.  I have reviewed all clinicals and have shared all with rehab MD.  I have approval from rehab MD to admit to acute inpatient rehab today.  Preadmission Screen Completed By:  Retta Diones, 01/08/2017 3:57 PM ______________________________________________________________________   Discussed status with Dr. Posey Pronto on 01/08/17 at 1557 and received telephone approval for admission today.  Admission Coordinator:  Retta Diones, time  1557/Date 01/08/17   Assessment/Plan: Diagnosis:  Watershed infarct, Acute encephalopathy  1. Does the need for close, 24 hr/day  Medical supervision in concert with the patient's rehab needs make it unreasonable for this patient to be served in a less intensive setting? Yes 2. Co-Morbidities requiring supervision/potential complications: dysphagia (advance diet as tolerated), HTN (monitor and provide prns in accordance with increased physical exertion and pain), alcohol abuse (counsel), nicotine abuse (counsel), hiatal hernia, hyperlipidemia, peripheral neuropathy 3. Due to bladder management, safety, disease management, medication administration and patient education, does the patient require 24 hr/day rehab nursing? Yes 4. Does the patient require coordinated care of a physician, rehab nurse, PT (1-2 hrs/day, 5 days/week), OT (1-2 hrs/day, 5 days/week) and SLP (1-2 hrs/day, 5 days/week) to address physical and functional deficits in the context of the above medical diagnosis(es)? Yes Addressing deficits in the following areas: balance, endurance, locomotion, strength, transferring, bowel/bladder control, bathing, dressing, toileting, cognition and psychosocial support 5. Can the patient actively participate in an intensive therapy program of at least 3 hrs of therapy 5 days a week? Yes 6. The potential for patient to make measurable gains while on inpatient rehab is excellent 7. Anticipated functional outcomes upon discharge from inpatients are: modified independent PT, modified independent and supervision OT, modified independent and supervision SLP 8. Estimated rehab length of stay to reach the above functional goals is: 8-12 days. 9. Does the patient have adequate social supports to accommodate these discharge functional goals? Potentially 10. Anticipated D/C setting: Home 11. Anticipated post D/C treatments: HH therapy and Home excercise program 12. Overall Rehab/Functional Prognosis:  good   RECOMMENDATIONS: This patient's condition is appropriate for continued rehabilitative care in the following setting: CIR Patient has agreed to participate in recommended program. Yes Note that insurance prior authorization may be required for reimbursement for recommended care.  Delice Lesch, MD, Mellody Drown Jodell Cipro M 01/08/2017

## 2017-01-09 ENCOUNTER — Inpatient Hospital Stay (HOSPITAL_COMMUNITY): Payer: Self-pay | Admitting: Physical Therapy

## 2017-01-09 ENCOUNTER — Inpatient Hospital Stay (HOSPITAL_COMMUNITY): Payer: Medicare Other

## 2017-01-09 ENCOUNTER — Inpatient Hospital Stay (HOSPITAL_COMMUNITY): Payer: Medicare Other | Admitting: Occupational Therapy

## 2017-01-09 ENCOUNTER — Inpatient Hospital Stay (HOSPITAL_COMMUNITY): Payer: Self-pay | Admitting: Speech Pathology

## 2017-01-09 ENCOUNTER — Encounter (HOSPITAL_COMMUNITY): Payer: Self-pay

## 2017-01-09 DIAGNOSIS — E46 Unspecified protein-calorie malnutrition: Secondary | ICD-10-CM

## 2017-01-09 DIAGNOSIS — E8809 Other disorders of plasma-protein metabolism, not elsewhere classified: Secondary | ICD-10-CM

## 2017-01-09 DIAGNOSIS — J90 Pleural effusion, not elsewhere classified: Secondary | ICD-10-CM

## 2017-01-09 LAB — CBC WITH DIFFERENTIAL/PLATELET
BASOS ABS: 0 10*3/uL (ref 0.0–0.1)
BASOS PCT: 0 %
EOS ABS: 0.3 10*3/uL (ref 0.0–0.7)
EOS PCT: 5 %
HCT: 25.4 % — ABNORMAL LOW (ref 39.0–52.0)
Hemoglobin: 7.8 g/dL — ABNORMAL LOW (ref 13.0–17.0)
Lymphocytes Relative: 24 %
Lymphs Abs: 1.4 10*3/uL (ref 0.7–4.0)
MCH: 29.2 pg (ref 26.0–34.0)
MCHC: 30.7 g/dL (ref 30.0–36.0)
MCV: 95.1 fL (ref 78.0–100.0)
MONO ABS: 0.7 10*3/uL (ref 0.1–1.0)
Monocytes Relative: 11 %
NEUTROS ABS: 3.6 10*3/uL (ref 1.7–7.7)
Neutrophils Relative %: 60 %
PLATELETS: 462 10*3/uL — AB (ref 150–400)
RBC: 2.67 MIL/uL — ABNORMAL LOW (ref 4.22–5.81)
RDW: 14.8 % (ref 11.5–15.5)
WBC: 6 10*3/uL (ref 4.0–10.5)

## 2017-01-09 LAB — COMPREHENSIVE METABOLIC PANEL
ALBUMIN: 2.4 g/dL — AB (ref 3.5–5.0)
ALT: 19 U/L (ref 17–63)
AST: 17 U/L (ref 15–41)
Alkaline Phosphatase: 94 U/L (ref 38–126)
Anion gap: 7 (ref 5–15)
BUN: 13 mg/dL (ref 6–20)
CHLORIDE: 105 mmol/L (ref 101–111)
CO2: 24 mmol/L (ref 22–32)
Calcium: 8.8 mg/dL — ABNORMAL LOW (ref 8.9–10.3)
Creatinine, Ser: 0.96 mg/dL (ref 0.61–1.24)
GFR calc Af Amer: >60 mL/min (ref >60)
GFR calc non Af Amer: >60 mL/min (ref >60)
GLUCOSE: 90 mg/dL (ref 65–99)
POTASSIUM: 4.1 mmol/L (ref 3.5–5.1)
SODIUM: 136 mmol/L (ref 135–145)
Total Bilirubin: 0.4 mg/dL (ref 0.3–1.2)
Total Protein: 5.4 g/dL — ABNORMAL LOW (ref 6.5–8.1)

## 2017-01-09 LAB — IRON AND TIBC
IRON: 36 ug/dL — AB (ref 45–182)
Saturation Ratios: 17 % — ABNORMAL LOW (ref 17.9–39.5)
TIBC: 216 ug/dL — AB (ref 250–450)
UIBC: 180 ug/dL

## 2017-01-09 LAB — RETICULOCYTES
RBC.: 2.67 MIL/uL — ABNORMAL LOW (ref 4.22–5.81)
RETIC COUNT ABSOLUTE: 98.8 10*3/uL (ref 19.0–186.0)
Retic Ct Pct: 3.7 % — ABNORMAL HIGH (ref 0.4–3.1)

## 2017-01-09 LAB — FERRITIN: Ferritin: 81 ng/mL (ref 24–336)

## 2017-01-09 LAB — FOLATE: FOLATE: 32 ng/mL (ref >5.9)

## 2017-01-09 LAB — VITAMIN B12: Vitamin B-12: 505 pg/mL (ref 180–914)

## 2017-01-09 MED ORDER — INFLUENZA VAC SPLIT HIGH-DOSE 0.5 ML IM SUSY: 0.5000 mL | PREFILLED_SYRINGE | INTRAMUSCULAR | Status: DC

## 2017-01-09 MED ORDER — INFLUENZA VAC SPLIT HIGH-DOSE 0.5 ML IM SUSY: 0.5000 mL | PREFILLED_SYRINGE | INTRAMUSCULAR | Status: DC | PRN

## 2017-01-09 MED ORDER — PRO-STAT SUGAR FREE PO LIQD
30.0000 mL | Freq: Two times a day (BID) | ORAL | Status: DC
  Administered 2017-01-09 – 2017-01-16 (×15): 30 mL via ORAL
  Filled 2017-01-09 (×14): qty 30

## 2017-01-09 NOTE — Progress Notes (Signed)
Patient admitted in room 4W09 [facility], he is alert, oriented x4, see VS, no visual signs of acute distress noted, redness noted to sacrum, skin care performed, cream applied, small abrasion noted in right arm covered with tegaderm, no signs of infection or drainage noted, a small raised boil noted to right upper back, patient stated that it is old from home, no other skin issues noted, c/O back pain and medicated with Tylenol 650 mg po per patient's request. Will continue to monitor.

## 2017-01-09 NOTE — Evaluation (Signed)
Physical Therapy Assessment and Plan  Patient Details  Name: Jackson Howell MRN: [id number] Date of Birth: May 31, 1940  PT Diagnosis: Cognitive deficits, Difficulty walking and Muscle weakness Rehab Potential: Good ELOS: 9-13 days   Today's Date: 01/09/2017 PT Individual Time: 1300-1409 PT Individual Time Calculation (min): 69 min    Problem List:  Patient Active Problem List   Diagnosis Date Noted  . Hypoalbuminemia due to protein-calorie malnutrition ([place])   . Bilateral pleural effusion   . 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 Benign essential HTN   . Dysphagia   . ETOH abuse   . Tobacco abuse   . Peripheral polyneuropathy   . Urinary retention   . AKI (acute kidney injury) ([place])   . Anemia of chronic disease     Past Medical History:  Past Medical History:  Diagnosis Date  . Acute kidney injury ([place])   . Anoxic brain injury ([place])   . Dysphagia   . H/O emphysema   . Hiatal hernia   . History of esophageal stricture    s/p dilatation X 2  . HTN (hypertension)   . Hx of vasectomy   . Insomnia   . Nocturia more than twice per night    up 4-5 time/night  . Peripheral neuropathy    bilateral feet  . Pneumonia   . Protein-calorie malnutrition ([place])   . Pulseless electrical activity ([place])   . Respiratory failure ([place])   . RUE weakness   . Urinary hesitancy    Past Surgical History:  Past Surgical History:  Procedure Laterality Date  . APPENDECTOMY    . COLONOSCOPY W/ BIOPSIES AND POLYPECTOMY    . ESOPHAGEAL DILATION  2016  . KNEE ARTHROSCOPY Left   . TONSILLECTOMY AND ADENOIDECTOMY      Assessment & Plan Clinical Impression: Patient is a 76 y.o. year old male with recent admission to the hospital on 12/08/16 with low grade fever/chills, fatigue, multiple episodes of emesis and SOB due to empyema. Hospital course there complicated by septic shock with PEA arrest 12/10/16 with CPR X 17 minutes. Chest tube placed  with drainage of 2 L of purulent material and he was treated with Zosyn thorough 9/8. Noted to have severe dysphagia with NPO, aphasia and lethargy/encephalopathic--EEG negative for seizures. MRI brai n done showing subacute infarct left corona radiata and possible right caudate nucleus.  He was transferred to [facility] on 12/26/16 and has been making steady progress. Lethargy has resolved, respiratory status improved and has been started on dysphagia 2, nectar liquids.  Patient transferred to CIR on 01/08/2017 .   Patient currently requires min with mobility secondary to muscle weakness, decreased cardiorespiratoy endurance and decreased standing balance and decreased balance strategies.  Prior to hospitalization, patient was independent  with mobility and lived with Family in a House home.  Home access is 1Stairs to enter.  Patient will benefit from skilled PT intervention to maximize safe functional mobility, minimize fall risk and decrease caregiver burden for planned discharge home with intermittent assist.  Anticipate patient will benefit from follow up [facility] at discharge.  PT - End of Session Activity Tolerance: Tolerates 30+ min activity with multiple rests Endurance Deficit: Yes PT Assessment Rehab Potential (ACUTE/IP ONLY): Good PT Barriers to Discharge: Decreased caregiver support PT Barriers to Discharge Comments: pt will be home alone PT Patient demonstrates impairments in the following area(s): Balance;Endurance;Motor PT Transfers Functional Problem(s): Bed Mobility;Bed to Chair;Car;Furniture PT Locomotion Functional Problem(s): Ambulation;Stairs;Wheelchair Mobility PT  Plan PT Intensity: Minimum of 1-2 x/day ,45 to 90 minutes PT Frequency: 5 out of 7 days PT Duration Estimated Length of Stay: 9-13 days PT Treatment/Interventions: Ambulation/gait training;Balance/vestibular training;Cognitive remediation/compensation;Community reintegration;Discharge planning;DME/adaptive equipment  instruction;Functional mobility training;Neuromuscular re-education;Pain management;Patient/family education;Splinting/orthotics;Stair training;Therapeutic Activities;Therapeutic Exercise;UE/LE Strength taining/ROM;UE/LE Coordination activities;Wheelchair propulsion/positioning PT Transfers Anticipated Outcome(s): mod I PT Locomotion Anticipated Outcome(s): mod I PT Recommendation Follow Up Recommendations: Home health PT Patient destination: Home Equipment Recommended: To be determined  Skilled Therapeutic Intervention Pt participated in skilled PT eval and was educated on PT goals and POC.  Pt performed gait in home and controlled environments with min A with RW.  Gait without RW with mod A x 15'.  Standing balance without AD with min A with mini squats and wt shifts.  Simulated car transfer to small SUV height car with min A.  Curb step entry practice x 4 with min A with RW.  W/c mobility with bilat UEs supervision x 50'.  nustep for UE/LE endurance x 6 minutes level 4.   PT Evaluation Precautions/Restrictions Precautions Precautions: Fall Restrictions Weight Bearing Restrictions: No   Pain Pain Assessment Pain Assessment: No/denies pain Home Living/Prior Functioning Home Living Available Help at Discharge: Available PRN/intermittently;Family Type of Home: House Home Access: Stairs to enter [employer] of Steps: 1 Home Layout: Two level;Laundry or work area in basement;Able to live on main level with bedroom/bathroom  Lives With: Family Prior Function Level of Independence: Independent with basic ADLs;Independent with homemaking with ambulation;Independent with gait;Independent with transfers  Able to Take Stairs?: Yes Driving: Yes Vocation: Part time employment Vocation Requirements: computer work, 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  Cognition Overall Cognitive Status: Impaired/Different from baseline Arousal/Alertness: Awake/alert Orientation Level: Oriented X4 Memory:  Impaired Memory Impairment: Decreased recall of new information Awareness: Appears intact Problem Solving: Appears intact Safety/Judgment: Appears intact Sensation Sensation Light Touch: Appears Intact Proprioception: Appears Intact Coordination Gross Motor Movements are Fluid and Coordinated: No Coordination and Movement Description: Limited shoulder ROM, ?RTC Motor  Motor Motor - Skilled Clinical Observations: generalized weakness   Trunk/Postural Assessment  Cervical Assessment Cervical Assessment:  (forward head) Thoracic Assessment Thoracic Assessment:  (mild kyphosis) Lumbar Assessment Lumbar Assessment:  (posterior pelvic tilt) Postural Control Postural Control: Deficits on evaluation Righting Reactions: delayed  Balance Static Standing Balance Static Standing - Comment/# of Minutes: min A without AD Dynamic Standing Balance Dynamic Standing - Comments: mod A without AD Extremity Assessment      RLE Assessment RLE Assessment:  (grossly 3+/5) LLE Assessment LLE Assessment:  (grossly 3+/5)   See Function Navigator for Current Functional Status.   Refer to Care Plan for Long Term Goals  Recommendations for other services: None   Discharge Criteria: Patient will be discharged from PT if patient refuses treatment 3 consecutive times without medical reason, if treatment goals not met, if there is a change in medical status, if patient makes no progress towards goals or if patient is discharged from hospital.  The above assessment, treatment plan, treatment alternatives and goals were discussed and mutually agreed upon: by patient  Kirstin Kugler 01/09/2017, 2:10 PM

## 2017-01-09 NOTE — Progress Notes (Signed)
Jamse Arn, MD Physician Signed Physical Medicine and Rehabilitation  PMR Pre-admission Date of Service: 01/08/2017 3:33 PM  Related encounter: Admission (Discharged) from 12/26/2016 in [facility]       _0 Hide copied text _1 Hover for attribution information   Secondary Market PMR Admission Coordinator Pre-Admission Assessment  Patient: Jackson Howell is an 76 y.o., male MRN: [id number] DOB: 1940-11-28 Height: 6' (182.9 cm) Weight: 75.8 kg (167 lb 1.7 oz)  Insurance Information HMO: No   PPO:       PCP:       IPA:       80/20:       OTHER:   PRIMARY:  Medicare A/B      Policy#: [id number]      Subscriber: [name] CM Name:        Phone#:       Fax#:   Pre-Cert#:        Employer:  Retired Benefits:  Phone #:       Name: Checked in [employer] source [employer]. Date: 01/28/08     Deduct: $1340      Out of Pocket Max: none      Life Max: N/A CIR: 100%      SNF: 100 days Outpatient: 80%     Co-Pay: 20% Home Health: 100%      Co-Pay: none DME: 80%     Co-Pay: 20% Providers: patient's choice  SECONDARY: BCBS      Policy#: [id number]      Subscriber:  [name] CM Name:        Phone#:       Fax#:   Pre-Cert#:        Employer: Retired Benefits:  Phone #: [phone]     Name:   Eff. Date:       Deduct:        Out of Pocket Max:        Life Max:   CIR:        SNF:   Outpatient:       Co-Pay:   Home Health:        Co-Pay:   DME:       Co-Pay:    Emergency Contact Information        Contact Information    Name Relation Home Work Mobile   [place] Sister [phone]     [name] [phone]        Current Medical History  Patient Admitting Diagnosis:  Acute encephalopathy, watershed infarct  History of Present Illness: A 76 yo male was admitted to [facility] on 12/26/16 after being treated at [facility].  He had dysphagia, anoxic brain injury, hypoxic encephalopathy.  He had  recently been treated for pneumonia and presented to [facility] with complaints of worsening chest pain and left flank pain.  The patient developed septic shock secondary to parapneumonic empyema.  He had chest tubes placed 12/10/16 with 2 liters of purulent fluid and was started on antibiotics.  Chest tubes were removed on 12/19/16.  While at [facility], the patient developed respiratory failure and MRI revealed a watershed infarct.  Palliative care was consulted and the patient was made a DNR.  On [facility], he has progressed with diet to dysphagia 2, nectar thick liquids and is on a fluid restriction of 1500 mL/day.  He has been receiving and participating with PT/OT/SLP and is progressing well.  Recommendations from [facility] were for inpatient rehab admission.  Patient to be admitted  today for acute inpatient rehab program.                          Patient's medical record from [facility] has been reviewed by the rehabilitation admission coordinator and physician.  Past Medical History  Hypertension, alcohol abuse, nicotine abuse, hiatal hernia, hyperlipidemia, peripheral neuropathy  Family History   No pertinent family history of shock.  Prior Rehab/Hospitalizations Has the patient had major surgery during 100 days prior to admission? No              Current Medications See MAR from [facility]  Patients Current Diet:  Dysphagia 2, nectar thick liquids, fluid restrict 1500 mL/day  Precautions / Restrictions Precautions Precautions: Fall Precautions/Special Needs: Swallowing   Has the patient had 2 or more falls or a fall with injury in the past year?No  Prior Activity Level Community (5-7x/wk): Went out daily.  Was driving.  Prior Functional Level Self Care: Did the patient need help bathing, dressing, using the toilet or eating?  Independent  Indoor Mobility: Did the patient need assistance with walking from room to room (with or without device)?  Independent  Stairs: Did the patient need assistance with internal or external stairs (with or without device)? Independent  Functional Cognition: Did the patient need help planning regular tasks such as shopping or remembering to take medications? Independent  Home Assistive Devices / Equipment Home Assistive Devices/Equipment: None  Prior Device Use: Indicate devices/aids used by the patient prior to current illness, exacerbation or injury? None   Prior Functional Level Current Functional Level  Bed Mobility  Independent  Min assist   Transfers  Independent  Min assist   Mobility - Walk/Wheelchair  Independent  Min assist (Ambulated 15 feet RW.)   Upper Body Dressing  Independent  Min assist   Lower Body Dressing  Independent  Mod assist   Grooming  Independent  Other (Set up)   Eating/Drinking  Independent  Other (Set up)   Toilet Transfer  Independent  Min assist   Bladder Continence   WDL  Condom catheter   Bowel Management  WDL  WDL  Stair Climbing  Not tried Other (Not tried.)   Communication  Verbal and intact  Verbal, intact   Memory  WDL  Decreased STM, impaired   Cooking/Meal Prep  Independent      Housework  Independent    Money Management  Independent    Driving  Independent     Special needs/care consideration BiPAP/CPAP No CPM No Continuous Drip IV No Dialysis No        Life Vest No Oxygen no Special Bed No Trach Size No Wound Vac (area) No   Skin No                            Bowel mgmt: WDL Bladder mgmt: Condom catheter Diabetic mgmt No  Previous Home Environment Living Arrangements: Alone  Lives With: Alone Available Help at Discharge: Family, Available PRN/intermittently Type of Home: House Home Layout: Two level, Laundry or work area in basement, Able to live on main level with bedroom/bathroom Alternate Level Stairs-Number of Steps: 22  steps Home Access: Stairs to enter [employer] of Steps: 22 steps at back entrance  Discharge Living Setting Plans for Discharge Living Setting: House, Lives with (comment) (Plans home with daughter short term.) Type of Home at Discharge: House Discharge Home Layout: Two  level, Bed/bath upstairs, Able to live on main level with bedroom/bathroom (Guest bedroom on main level for patient.) Alternate Level Stairs-Number of Steps: Flight Discharge Home Access: Stairs to enter Entrance Stairs-Number of Steps: 2 step entry  Social/Family/Support Systems Patient Roles: Parent, Other (Comment) (Has a daughter, son and a lady friend.) Contact Information: Cyndia Diver - daughter - [phone] Anticipated Caregiver: Dtr, son-in-law Ability/Limitations of Caregiver: Dtr and son-in-law work during the day Caregiver Availability: Intermittent Discharge Plan Discussed with Primary Caregiver: Yes Is Caregiver In Agreement with Plan?: Yes Does Caregiver/Family have Issues with Lodging/Transportation while Pt is in Rehab?: No  Goals/Additional Needs Patient/Family Goal for Rehab: PT/OT/SLP mod I and supervision goals Expected length of stay: 10-12 days Cultural Considerations: Quaker Dietary Needs: Dys 2, nectar thick liquids, 1500 ML fluid restriction Equipment Needs: TBD Pt/Family Agrees to Admission and willing to participate: Yes Program Orientation Provided & Reviewed with Pt/Caregiver Including Roles  & Responsibilities: Yes  Patient Condition: I met with patient and I spoke with his daughter by phone.  Patient his made significant progress in the past week and is now ready for intense therapy with goal to return home with daughter.  Daughter would like patient to be able to get around, ambulate and take care of his basic needs prior to coming home to her house short term.  Patient is tolerating up in chair and is participating actively with PT/OT/SLP therapies.  I have reviewed  all clinicals and have shared all with rehab MD.  I have approval from rehab MD to admit to acute inpatient rehab today.  Preadmission Screen Completed By:  Retta Diones, 01/08/2017 3:57 PM ______________________________________________________________________   Discussed status with Dr. Posey Pronto on 01/08/17 at 1557 and received telephone approval for admission today.  Admission Coordinator:  Retta Diones, time 1557/Date 01/08/17   Assessment/Plan: Diagnosis:  Watershed infarct, Acute encephalopathy  1. Does the need for close, 24 hr/day  Medical supervision in concert with the patient's rehab needs make it unreasonable for this patient to be served in a less intensive setting? Yes 2. Co-Morbidities requiring supervision/potential complications: dysphagia (advance diet as tolerated), HTN (monitor and provide prns in accordance with increased physical exertion and pain), alcohol abuse (counsel), nicotine abuse (counsel), hiatal hernia, hyperlipidemia, peripheral neuropathy 3. Due to bladder management, safety, disease management, medication administration and patient education, does the patient require 24 hr/day rehab nursing? Yes 4. Does the patient require coordinated care of a physician, rehab nurse, PT (1-2 hrs/day, 5 days/week), OT (1-2 hrs/day, 5 days/week) and SLP (1-2 hrs/day, 5 days/week) to address physical and functional deficits in the context of the above medical diagnosis(es)? Yes Addressing deficits in the following areas: balance, endurance, locomotion, strength, transferring, bowel/bladder control, bathing, dressing, toileting, cognition and psychosocial support 5. Can the patient actively participate in an intensive therapy program of at least 3 hrs of therapy 5 days a week? Yes 6. The potential for patient to make measurable gains while on inpatient rehab is excellent 7. Anticipated functional outcomes upon discharge from inpatients are: modified independent PT, modified  independent and supervision OT, modified independent and supervision SLP 8. Estimated rehab length of stay to reach the above functional goals is: 8-12 days. 9. Does the patient have adequate social supports to accommodate these discharge functional goals? Potentially 10. Anticipated D/C setting: Home 11. Anticipated post D/C treatments: HH therapy and Home excercise program 12. Overall Rehab/Functional Prognosis: good   RECOMMENDATIONS: This patient's condition is appropriate for continued rehabilitative care in the  following setting: CIR Patient has agreed to participate in recommended program. Yes Note that insurance prior authorization may be required for reimbursement for recommended care.  Delice Lesch, MD, Mellody Drown Jodell Cipro M 01/08/2017

## 2017-01-09 NOTE — Progress Notes (Signed)
Nutrition Brief Note  RD consulted to discuss food choices on Dysphagia 2 diet.  Wt Readings from Last 15 Encounters:  01/09/17 165 lb 5.5 oz (75 kg)  01/08/17 167 lb 1.7 oz (75.8 kg)   Body mass index is 22.42 kg/m. Patient meets criteria for normal based on current BMI.   Current diet order is Dysphagia 3-Nectar thick liquids (upgraded from dysphagia 2 this afternoon), patient is consuming approximately 85-100% of meals at this time. Pt reports consuming 2 meals and a boost PTA, describing mostly soft foods. Pt hopeful to have liquids upgraded at some point.   Continue Pro-Stat for increase nutrition needs while admitted.  Labs and medications reviewed.   No nutrition interventions warranted at this time. If nutrition issues arise, please consult RD.   Jackson KaufmannAllison Ioannides, MS, RDN, LDN 01/09/2017 3:37 PM

## 2017-01-09 NOTE — Progress Notes (Signed)
[facility] PHYSICAL MEDICINE & REHABILITATION     PROGRESS NOTE  Subjective/Complaints:  Patient seen lying in bed this morning. He states he slept fairly overnight, but had a difficult time adjusting to new surroundings. He is looking forward to beginning therapies today. He has questions regarding his schedule and meals.  ROS: Denies CP, SOB, nausea, vomiting, diarrhea.  Objective: Vital Signs: Blood pressure 127/68, pulse 80, temperature 98 F (36.7 C), temperature source Oral, resp. rate 18, height 6' (1.829 m), weight 75 kg (165 lb 5.5 oz), SpO2 97 %. No results found.  Recent Labs  01/09/17 0528  WBC 6.0  HGB 7.8*  HCT 25.4*  PLT 462*    Recent Labs  01/09/17 0528  NA 136  K 4.1  CL 105  GLUCOSE 90  BUN 13  CREATININE 0.96  CALCIUM 8.8*   CBG (last 3)  No results for input(s): GLUCAP in the last 72 hours.  Wt Readings from Last 3 Encounters:  01/09/17 75 kg (165 lb 5.5 oz)  01/08/17 75.8 kg (167 lb 1.7 oz)    Physical Exam:  BP 127/68 (BP Location: Right Arm)   Pulse 80   Temp 98 F (36.7 C) (Oral)   Resp 18   Ht 6' (1.829 m)   Wt 75 kg (165 lb 5.5 oz)   SpO2 97%   BMI 22.42 kg/m  Constitutional: He appears well-developed and well-nourished. NAD.  HENT: Normocephalic and atraumatic.  Eyes: No discharge. EOM are normal.  Cardiovascular: Normal rate and regular rhythm.  No JVD. Respiratory: Effort normal and breath sounds normal.  GI: Bowel sounds are normal. He exhibits no distension.  Genitourinary Foley in place.   Musculoskeletal: He exhibits no edema or tenderness.  Right shoulder limited due to RTC pathology.   Neurological: He is alert and oriented.  Motor: RUE: Shoulder abduction 3-/5, distally 4/5 (rotator cuff pathology) RLE: 4/5 proximal to distal LUE/LLE: 4+/5 proximal to distal  Skin: Skin is warm and dry. Resolving ecchymosis bilateral forearms   Psychiatric: He has a normal mood and affect. His behavior is normal. Thought content  normal.    Assessment/Plan: 1. Functional deficits secondary to debility which require 3+ hours per day of interdisciplinary therapy in a comprehensive inpatient rehab setting. Physiatrist is providing close team supervision and 24 hour management of active medical problems listed below. Physiatrist and rehab team continue to assess barriers to discharge/monitor patient progress toward functional and medical goals.  Function:  Bathing Bathing position      Bathing parts      Bathing assist        Upper Body Dressing/Undressing Upper body dressing                    Upper body assist        Lower Body Dressing/Undressing Lower body dressing                                  Lower body assist        Toileting Toileting          Toileting assist     Transfers Chair/bed transfer             Locomotion Ambulation           Wheelchair          Cognition Comprehension    Expression    Social Interaction  Problem Solving    Memory      Medical Problem List and Plan: 1.  Decreased endurance, mobility deficits, and limitation in self-care secondary to debility.   Begin CIR 2.  DVT Prophylaxis/Anticoagulation: Pharmaceutical: Lovenox 3. Pain Management: tylenol prn  4. Mood: LCSW to follow for evaluation and support.  5. Neuropsych: This patient is ?fully capable of making decisions on his own behalf. 6. Skin/Wound Care: Routine pressure relief measures 7. Fluids/Electrolytes/Nutrition: Monitor I/Os. Used to drink boost at home.  8. HTN: Monitor BP bid--continue metoprolol bid. May need to hold lasix for now. Resume home amlodipine, losartan as needed.  Monitor with increased mobility 9. Dysphagia: On dysphagia 2, nectar liquids with poor intake due to food choices.  10. Alcohol abuse: continue thiamine and folic acid. 11. Tobacco abuse: Continue nicotine patch.  12. Peripheral neuropathy: Resumed low dose gabapentin at bedtime  (PTA 600 mg daily at bedtime) to help with insomnia. Monitor for symptoms as activity increases.  13. Urinary retention: Removed foley and started bladder training.   UA equivocal,UCS pending 14. Code status: Discussed-- Full Code.  15. Acute kidney injury: ? Resolved  Encourage fluid intake  Will continue to monitor due to fluids  16. Anemia of chronic illness?:   Hemoglobin 7.8 on 9/13  Continue to monitor 17. Bilateral pleural effusions  Per CXR, reviewed  Continue Lasix  Continue to monitor 18. Hypoalbuminemia  Supplement initiated on 9/13   LOS (Days) 1 A FACE TO FACE EVALUATION WAS PERFORMED  Ankit Karis Juba 01/09/2017 8:30 AM

## 2017-01-09 NOTE — Progress Notes (Signed)
Patient information reviewed and entered into [facility] System by Becky Keaisha Sublette, covering PPS coordinator. Information including medical coding and functional independence measure will be reviewed and updated through discharge.  Per nursing, patient was given "Data Collection Information Summary for Patients in Inpatient Rehabilitation Facilities with attached Privacy Act Statement Health Care Records" upon admission.     

## 2017-01-09 NOTE — Evaluation (Signed)
Occupational Therapy Assessment and Plan  Patient Details  Name: Jackson Howell MRN: [id number] Date of Birth: March 22, 1941  OT Diagnosis: disturbance of vision and muscle weakness (generalized) Rehab Potential: Rehab Potential (ACUTE ONLY): Good ELOS: 9-13 days   Today's Date: 01/09/2017 OT Individual Time: 0820-0930 OT Individual Time Calculation (min): 70 min     Problem List:  Patient Active Problem List   Diagnosis Date Noted  . 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 Benign essential HTN   . Dysphagia   . ETOH abuse   . Tobacco abuse   . Peripheral polyneuropathy   . Urinary retention   . AKI (acute kidney injury) ([place])   . Anemia of chronic disease     Past Medical History:  Past Medical History:  Diagnosis Date  . Acute kidney injury ([place])   . Anoxic brain injury ([place])   . Dysphagia   . H/O emphysema   . Hiatal hernia   . History of esophageal stricture    s/p dilatation X 2  . HTN (hypertension)   . Hx of vasectomy   . Insomnia   . Nocturia more than twice per night    up 4-5 time/night  . Peripheral neuropathy    bilateral feet  . Pneumonia   . Protein-calorie malnutrition ([place])   . Pulseless electrical activity ([place])   . Respiratory failure ([place])   . RUE weakness   . Urinary hesitancy    Past Surgical History:  Past Surgical History:  Procedure Laterality Date  . APPENDECTOMY    . COLONOSCOPY W/ BIOPSIES AND POLYPECTOMY    . ESOPHAGEAL DILATION  2016  . KNEE ARTHROSCOPY Left   . TONSILLECTOMY AND ADENOIDECTOMY      Assessment & Plan Clinical Impression: Patient is a 76 y.o. year old male with history of HTN, peripheral neuropathy, dysphagia s/p esophageal dilatation, ETOH abuse, recent PNA who was admitted to [facility] on 12/08/16 with low grade fever/chills, fatigue, multiple episodes of emesis and SOB due to empyema. Hospital course there complicated by septic shock with PEA arrest  12/10/16 with CPR X 17 minutes. Chest tube placed with drainage of 2 L of purulent material and he was treated with Zosyn thorough 9/8. Noted to have severe dysphagia with NPO, aphasia and lethargy/encephalopathic--EEG negative for seizures. MRI brai n done showing subacute infarct left corona radiata and possible right caudate nucleus.  He was transferred to [facility] on 12/26/16 and has been making steady progress. Lethargy has resolved, respiratory status improved and has been started on dysphagia 2, nectar liquids. Esophagogram negative for stricture and no aspiration noted. Foley placed due to urinary retention.  He continues to be debilitated with decline in mobility and ability to carryout ADL tasks.   Patient transferred to CIR on 01/08/2017 .    Patient currently requires min with basic self-care skills secondary to muscle weakness, decreased visual motor skills and decreased postural control and decreased balance strategies.  Prior to hospitalization, patient could complete ADLs and IADLs with independent .  Patient will benefit from skilled intervention to increase independence with basic self-care skills and increase level of independence with iADL prior to discharge home independently.  Anticipate patient will require intermittent supervision and follow up outpatient.  OT - End of Session Activity Tolerance: Tolerates 30+ min activity with multiple rests Endurance Deficit: Yes OT Assessment Rehab Potential (ACUTE ONLY): Good OT Barriers to Discharge: Lack of/limited family support OT Patient demonstrates impairments in the  following area(s): Balance;Endurance;Motor;Safety OT Basic ADL's Functional Problem(s): Grooming;Bathing;Dressing;Toileting OT Advanced ADL's Functional Problem(s): Simple Meal Preparation;Laundry OT Transfers Functional Problem(s): Toilet;Tub/Shower OT Additional Impairment(s): None OT Plan OT Intensity: Minimum of 1-2 x/day, 45 to 90 minutes OT Frequency: 5 out of 7  days OT Duration/Estimated Length of Stay: 9-13 days OT Treatment/Interventions: Medical illustrator training;Community reintegration;Discharge planning;Disease mangement/prevention;DME/adaptive equipment instruction;Functional mobility training;Neuromuscular re-education;Pain management;Patient/family education;Psychosocial support;Self Care/advanced ADL retraining;Therapeutic Activities;Therapeutic Exercise;Visual/perceptual remediation/compensation OT Self Feeding Anticipated Outcome(s): No goal OT Basic Self-Care Anticipated Outcome(s): Mod I OT Toileting Anticipated Outcome(s): Mod I OT Bathroom Transfers Anticipated Outcome(s): Mod I OT Recommendation Recommendations for Other Services: Therapeutic Recreation consult Therapeutic Recreation Interventions: Outing/community reintergration Patient destination: Home Follow Up Recommendations: Outpatient OT Equipment Recommended: Tub/shower bench;3 in 1 bedside comode   Skilled Therapeutic Intervention OT eval completed with discussion of rehab process, OT purpose, POC, ELOS, and goals.  ADL assessment completed with bathing and dressing at sit > stand level.  Pt ambulated to room shower with RW with min assist and min cues for sequencing of transfer into shower.  Pt completed bathing with mod assist sit > stand due to weakness and difficulty from lower surface.  Unable to don socks due to fatigue and weakness.  Completed grooming in sitting due to fatigue, requiring min assist with any standing balance.  Noted impaired vision on Rt with need to further assess functionally.  Pt also with limited Rt shoulder ROM impacting ability to reach for soap on soap dish during bathing, therefore utilizing LUE more for bathing, dressing, and grooming tasks.  OT Evaluation Precautions/Restrictions  Precautions Precautions: Fall Restrictions Weight Bearing Restrictions: No Pain Pain Assessment Pain Assessment: No/denies pain Home Living/Prior  Functioning Home Living Available Help at Discharge: Family, Available PRN/intermittently Type of Home: House Home Access: Stairs to enter [employer] of Steps: 2 steps to main entrance, 13 steps from garage Entrance Stairs-Rails: Can reach both Home Layout: Two level, Laundry or work area in basement, Able to live on main level with bedroom/bathroom (printer/office in basement) Bathroom Shower/Tub: Chiropodist: Standard  Lives With: Alone IADL History Homemaking Responsibilities: Yes Meal Prep Responsibility: Primary Laundry Responsibility: Primary Cleaning Responsibility: Primary Bill Paying/Finance Responsibility: Primary Shopping Responsibility: Primary Current License: Yes Occupation: Part time employment, Works at home Prior Function Level of Independence: Independent with basic ADLs, Independent with homemaking with ambulation, Independent with gait  Able to Take Stairs?: Yes Driving: Yes Vocation: Part time employment Vocation Requirements: computer work, 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 ADL  See Function Navigator Vision Baseline Vision/History: Wears glasses Wears Glasses: Reading only Patient Visual Report: No change from baseline Vision Assessment?: Yes Ocular Range of Motion: Restricted on the right (could scan to Rt but eyes would quickly jump back to midline) Tracking/Visual Pursuits: Decreased smoothness of horizontal tracking Saccades: Additional eye shifts occurred during testing;Undershoots (Difficulty scanning to Rt) Visual Fields: No apparent deficits Cognition Overall Cognitive Status: Within Functional Limits for tasks assessed Arousal/Alertness: Awake/alert Orientation Level: Person;Place;Situation Person: Oriented Place: Oriented Situation: Oriented Year: 2018 Month: September Day of Week: Correct (Thursday) Memory: Appears intact Immediate Memory Recall: Bed;Blue;Sock Memory Recall: Sock;Blue;Bed Memory Recall Sock: Without  Cue Memory Recall Blue: Without Cue Memory Recall Bed: Without Cue Attention: Alternating Awareness: Appears intact Problem Solving: Appears intact Safety/Judgment: Appears intact Sensation Sensation Light Touch: Appears Intact Hot/Cold: Appears Intact Proprioception: Appears Intact Coordination Gross Motor Movements are Fluid and Coordinated: No Fine Motor Movements are Fluid and Coordinated: Yes Coordination and Movement Description: Limited shoulder ROM, ?RTC Finger  Nose Finger Test: [facility] Extremity/Trunk Assessment RUE Assessment RUE Assessment: Exceptions to [facility] (shoulder flexion limited to grossly 20* ?RTC impaired.  Elbow and distal WFL) LUE Assessment LUE Assessment: Within Functional Limits   See Function Navigator for Current Functional Status.   Refer to Care Plan for Long Term Goals  Recommendations for other services: Therapeutic Recreation  Outing/community reintegration   Discharge Criteria: Patient will be discharged from OT if patient refuses treatment 3 consecutive times without medical reason, if treatment goals not met, if there is a change in medical status, if patient makes no progress towards goals or if patient is discharged from hospital.  The above assessment, treatment plan, treatment alternatives and goals were discussed and mutually agreed upon: by patient  Simonne Come 01/09/2017, 11:47 AM

## 2017-01-09 NOTE — Plan of Care (Signed)
Problem: RH BOWEL ELIMINATION Goal: RH STG MANAGE BOWEL W/MEDICATION W/ASSISTANCE STG Manage Bowel with Medication with min Assistance.  Outcome: Progressing Last bowel movement 01/08/2017, patient stated he is continent  Problem: RH BLADDER ELIMINATION Goal: RH STG MANAGE BLADDER WITH EQUIPMENT WITH ASSISTANCE STG Manage Bladder With Equipment With Assistance  Outcome: Not Progressing Has Foley catheter  Problem: RH SKIN INTEGRITY Goal: RH STG SKIN FREE OF INFECTION/BREAKDOWN Outcome: Not Progressing Abrasion to right arm covered with Tegaderm, no signs of infection noted  Problem: RH SAFETY Goal: RH STG ADHERE TO SAFETY PRECAUTIONS W/ASSISTANCE/DEVICE STG Adhere to Safety Precautions With supervision  Assistance/Device.  Outcome: Progressing Safety precautions maintained, patient is aware, no safety issues noted Goal: RH STG DECREASED RISK OF FALL WITH ASSISTANCE STG Decreased Risk of Fall With Assistance.  Outcome: Progressing No fall or injury  Problem: RH PAIN MANAGEMENT Goal: RH STG PAIN MANAGED AT OR BELOW PT'S PAIN GOAL 2/10  Outcome: Progressing Medicated once for back pain with moderate relief

## 2017-01-09 NOTE — Evaluation (Signed)
Speech Language Pathology Assessment and Plan  Patient Details  Name: Jackson Howell MRN: [id number] Date of Birth: 06-Jul-1940  SLP Diagnosis: Dysphagia  Rehab Potential: Good ELOS: 10-14 days     Today's Date: 01/09/2017 SLP Individual Time: 1100-1200 SLP Individual Time Calculation (min): 60 min   Problem List:  Patient Active Problem List   Diagnosis Date Noted  . Hypoalbuminemia due to protein-calorie malnutrition ([place])   . Bilateral pleural effusion   . 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 Benign essential HTN   . Dysphagia   . ETOH abuse   . Tobacco abuse   . Peripheral polyneuropathy   . Urinary retention   . AKI (acute kidney injury) ([place])   . Anemia of chronic disease    Past Medical History:  Past Medical History:  Diagnosis Date  . Acute kidney injury ([place])   . Anoxic brain injury ([place])   . Dysphagia   . H/O emphysema   . Hiatal hernia   . History of esophageal stricture    s/p dilatation X 2  . HTN (hypertension)   . Hx of vasectomy   . Insomnia   . Nocturia more than twice per night    up 4-5 time/night  . Peripheral neuropathy    bilateral feet  . Pneumonia   . Protein-calorie malnutrition ([place])   . Pulseless electrical activity ([place])   . Respiratory failure ([place])   . RUE weakness   . Urinary hesitancy    Past Surgical History:  Past Surgical History:  Procedure Laterality Date  . APPENDECTOMY    . COLONOSCOPY W/ BIOPSIES AND POLYPECTOMY    . ESOPHAGEAL DILATION  2016  . KNEE ARTHROSCOPY Left   . TONSILLECTOMY AND ADENOIDECTOMY      Assessment / Plan / Recommendation Clinical Impression   Jackson Howell is a 76 year old male with history of HTN, peripheral neuropathy, dysphagia s/p esophageal dilatation, ETOH abuse, recent PNA who was admitted to [facility] on 12/08/16 with low grade fever/chills, fatigue, multiple episodes of emesis and SOB due to empyema. Hospital course  there complicated by septic shock with PEA arrest 12/10/16 with CPR X 17 minutes. Chest tube placed with drainage of 2 L of purulent material and he was treated with Zosyn thorough 9/8. Noted to have severe dysphagia with NPO, aphasia and lethargy/encephalopathic--EEG negative for seizures. MRI brai n done showing subacute infarct left corona radiata and possible right caudate nucleus.  He was transferred to [facility] on 12/26/16 and has been making steady progress. Lethargy has resolved, respiratory status improved and has been started on dysphagia 2, nectar liquids. Esophagogram negative for stricture and no aspiration noted. Foley placed due to urinary retention.  He continues to be debilitated with decline in mobility and ability to carryout ADL tasks. CIR recommended by rehab team.  SLP evaluation completed on 01/09/2017 with the following results:   Pt presents with mild, delayed throat clearing following large consecutive sips via teaspoon of thin liquids and with mixed consistencies.  No overt s/s of aspiration with solids alone or with thickened liquids.  Recommend that pt be advanced to dys 3 solids but remain on nectar thick liquids until MBS can be completed.  Will initiate the water protocol for the time being to improve pt comfort and hydration while on thickened liquids.   Pt demonstrated mild memory deficits for new information; however, these seemed specifically centered around recall of swallowing precautions as pt was able  to recount details and results of esophageal study completed prior to CIR admission as well as his complete hospital course.  Question whether rationale for current diet was thoroughly explained to him and that is impacting recall versus primary memory deficit.  Pt would benefit from skilled ST while inpatient in order to maximize functional independence and reduce burden of care prior to discharge.    Skilled Therapeutic Interventions          Cognitive-linguistic and bedside  swallow evaluation completed with results and recommendations reviewed with family.   SLP provided extensive education regarding parameters and procedures of the water protocol with handouts posted in room to facilitate carryover in between therapy sessions.  All questions were answered to pt's satisfaction at this time.      SLP Assessment  Patient will need skilled Speech Lanaguage Pathology Services during CIR admission    Recommendations  SLP Diet Recommendations: Dysphagia 3 (Mech soft);Nectar Liquid Administration via: Cup Medication Administration: Whole meds with liquid Supervision: Patient able to self feed;Intermittent supervision to cue for compensatory strategies Compensations: Minimize environmental distractions;Slow rate;Small sips/bites Postural Changes and/or Swallow Maneuvers: Seated upright 90 degrees Oral Care Recommendations: Oral care prior to ice chip/H20;Oral care BID Recommendations for Other Services: Neuropsych consult Patient destination: Home Follow up Recommendations: Other (comment) (TBD) Equipment Recommended: To be determined    SLP Frequency 3 to 5 out of 7 days   SLP Duration  SLP Intensity  SLP Treatment/Interventions 10-14 days   Minumum of 1-2 x/day, 30 to 90 minutes  Cueing hierarchy;Dysphagia/aspiration precaution training;Patient/family education    Pain Pain Assessment Pain Assessment: No/denies pain  Prior Functioning Cognitive/Linguistic Baseline: Within functional limits Type of Home: House  Lives With: Alone Available Help at Discharge: Family;Available PRN/intermittently Vocation: Part time employment  Function:  Eating Eating   Modified Consistency Diet: Yes Eating Assist Level: More than reasonable amount of time           Cognition Comprehension Comprehension assist level: Follows basic conversation/direction with no assist  Expression   Expression assist level: Expresses basic needs/ideas: With no assist  Social  Interaction Social Interaction assist level: Interacts appropriately with others with medication or extra time (anti-anxiety, antidepressant).  Problem Solving Problem solving assist level: Solves basic 90% of the time/requires cueing < 10% of the time  Memory Memory assist level: Recognizes or recalls 75 - 89% of the time/requires cueing 10 - 24% of the time   Short Term Goals: Week 1: SLP Short Term Goal 1 (Week 1): Pt will consume therapeutic trials of thin liquids with minimal overt s/s of aspiration and mod I use of swallowing precautions.   SLP Short Term Goal 2 (Week 1): Pt will consume dys 3 textures and nectar thick liquids with minimal overt s/s of aspiration and mod I use of swallowing precautions.    Refer to Care Plan for Long Term Goals  Recommendations for other services: Neuropsych  Discharge Criteria: Patient will be discharged from SLP if patient refuses treatment 3 consecutive times without medical reason, if treatment goals not met, if there is a change in medical status, if patient makes no progress towards goals or if patient is discharged from hospital.  The above assessment, treatment plan, treatment alternatives and goals were discussed and mutually agreed upon: by patient  Emilio Math 01/09/2017, 12:44 PM

## 2017-01-10 ENCOUNTER — Inpatient Hospital Stay (HOSPITAL_COMMUNITY): Payer: Medicare Other | Admitting: Occupational Therapy

## 2017-01-10 ENCOUNTER — Inpatient Hospital Stay (HOSPITAL_COMMUNITY): Payer: Medicare Other | Admitting: Physical Therapy

## 2017-01-10 ENCOUNTER — Encounter (HOSPITAL_COMMUNITY): Payer: Self-pay

## 2017-01-10 ENCOUNTER — Encounter (HOSPITAL_COMMUNITY): Payer: Medicare Other | Admitting: Speech Pathology

## 2017-01-10 ENCOUNTER — Ambulatory Visit (HOSPITAL_COMMUNITY): Payer: Self-pay | Admitting: Speech Pathology

## 2017-01-10 ENCOUNTER — Inpatient Hospital Stay (HOSPITAL_COMMUNITY): Payer: Medicare Other

## 2017-01-10 LAB — URINE CULTURE: Culture: NO GROWTH

## 2017-01-10 MED ORDER — FUROSEMIDE 20 MG PO TABS
20.0000 mg | ORAL_TABLET | Freq: Every day | ORAL | Status: DC
  Administered 2017-01-11 – 2017-01-16 (×6): 20 mg via ORAL
  Filled 2017-01-10 (×6): qty 1

## 2017-01-10 MED ORDER — FINASTERIDE 5 MG PO TABS
5.0000 mg | ORAL_TABLET | Freq: Every day | ORAL | Status: DC
  Administered 2017-01-10 – 2017-01-16 (×7): 5 mg via ORAL
  Filled 2017-01-10 (×9): qty 1

## 2017-01-10 MED ORDER — ZOLPIDEM TARTRATE 5 MG PO TABS
5.0000 mg | ORAL_TABLET | Freq: Every evening | ORAL | Status: DC | PRN
  Administered 2017-01-10 – 2017-01-15 (×3): 5 mg via ORAL
  Filled 2017-01-10 (×3): qty 1

## 2017-01-10 MED ORDER — METOPROLOL TARTRATE 12.5 MG HALF TABLET
12.5000 mg | ORAL_TABLET | Freq: Two times a day (BID) | ORAL | Status: DC
  Administered 2017-01-10 – 2017-01-16 (×12): 12.5 mg via ORAL
  Filled 2017-01-10 (×12): qty 1

## 2017-01-10 NOTE — Progress Notes (Signed)
Social Work  Social Work Assessment and Plan  Patient Details  Name: Jackson Howell MRN: [id number] Date of Birth: 11-24-1940  Today's Date: 01/10/2017  Problem List:  Patient Active Problem List   Diagnosis Date Noted  . Hypoalbuminemia due to protein-calorie malnutrition ([facility])   . Bilateral pleural effusion   . 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 Benign essential HTN   . Dysphagia   . ETOH abuse   . Tobacco abuse   . Peripheral polyneuropathy   . Urinary retention   . AKI (acute kidney injury) ([facility])   . Anemia of chronic disease    Past Medical History:  Past Medical History:  Diagnosis Date  . Acute kidney injury ([facility])   . Anoxic brain injury ([facility])   . Dysphagia   . H/O emphysema   . Hiatal hernia   . History of esophageal stricture    s/p dilatation X 2  . HTN (hypertension)   . Hx of vasectomy   . Insomnia   . Nocturia more than twice per night    up 4-5 time/night  . Peripheral neuropathy    bilateral feet  . Pneumonia   . Protein-calorie malnutrition ([facility])   . Pulseless electrical activity ([facility])   . Respiratory failure ([facility])   . RUE weakness   . Urinary hesitancy    Past Surgical History:  Past Surgical History:  Procedure Laterality Date  . APPENDECTOMY    . COLONOSCOPY W/ BIOPSIES AND POLYPECTOMY    . ESOPHAGEAL DILATION  2016  . KNEE ARTHROSCOPY Left   . TONSILLECTOMY AND ADENOIDECTOMY     Social History:  reports that he has been smoking Cigarettes.  He has been smoking about 2.00 packs per day. He has never used smokeless tobacco. He reports that he drinks about 2.4 oz of alcohol per week . He reports that he does not use drugs.  Family / Support Systems Marital Status: Divorced Patient Roles: Parent, Other (Comment) (has a "lady friend") Children: daughter, Lincoln Maxin @ [phone] Other Supports: friend, Daiva Nakayama @ [phone] Anticipated Caregiver: Dtr,  son-in-law Ability/Limitations of Caregiver: Dtr and son-in-law work during the day Caregiver Availability: Intermittent Family Dynamics: Pt describes daughter and her family as very supportive but very much wants to be as independent as possible to decrease "burden" on them.  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   Return to Work Plans: Pt would like to be able to return to finish his existing contracts if possible. Legal Hisotry/Current Legal Issues: None Guardian/Conservator: None - per MD, pt is capable of making decision on his own behalf   Abuse/Neglect Physical Abuse: Denies Verbal Abuse: Denies Sexual Abuse: Denies Exploitation of patient/patient's resources: Denies Self-Neglect: Denies  Emotional Status Pt's affect, behavior adn adjustment status: Pt very pleasant, talkative and humorous.  He is hopeful about being able to regain his independence and very focused on physical strength.  He admits frustration with his current limitations, however, denies any significant emotional distress. - will monitor. Recent Psychosocial Issues: None Pyschiatric History: None Substance Abuse History: None  Patient / Family Perceptions, Expectations & Goals  Pt/Family understanding of illness & functional limitations: Pt and daughter with good understanding of his medical course and current functional limitations/ need for CIR. Premorbid pt/family roles/activities: completely independent and still driving Anticipated changes in roles/activities/participation: little change anticipated if able to reach mod ind  goals. Pt/family expectations/goals: "I would really like to get back home to [place] and back to my work."  [employer]: None Premorbid Home Care/DME Agencies: None Transportation available at discharge: yes  Discharge Planning Living Arrangements: Alone Support Systems: Children, Friends/neighbors Type of Residence: Private residence Civil engineer, contracting: [employer], Media planner (specify) Herbalist) Financial Resources: Restaurant manager, fast food Screen Referred: No Living Expenses: Own Money Management: Patient Does the patient have any problems obtaining your medications?: No Home Management: pt was independent Patient/Family Preliminary Plans: Pt to d/c to his daughter's home initially where evening support is available.  Eventually to return to his home in [place] Social Work Anticipated Follow Up Needs: HH/OP Expected length of stay: 9-13 days  Clinical Impression Elderly, talkative and very pleasant gentleman here following septic shock with bacterial PNA and lengthy hospitalization.  Able to complete assessment interview without any difficulty, however, daughter does feel his STM "isn't completely back to normal."  Pt and daughter note plan for pt to d/c to daughter's home locally at first until he feels comfortable returning to his home in [place].  Anticipate rather short LOS.  Will follow for support and d/c planning needs.  Giovanne Nickolson 01/10/2017, 4:11 PM

## 2017-01-10 NOTE — Progress Notes (Signed)
Modified Barium Swallow Progress Note  Patient Details  Name: BARKLEY KRATOCHVIL MRN: [id number] Date of Birth: 11-07-1940  Today's Date: 01/10/2017  Modified Barium Swallow completed.  Full report located under Chart Review in the Imaging Section.  Brief recommendations include the following:  Clinical Impression    Pt presents with a primarily sensory based dysphagia leading to delay in swallow initiation to the pyriforms with larger sips of both thin and thickened liquids.  Pt had intermittent penetration ranging in intensity from flash to deep of both thin and thickened liquids, all of which cleared from the airway with minimal cuing for use of intermittent throat clear and second swallow.  Airway was kept completely clear with small, controlled sips.  Oral phase was intact with adequate containment, manipulation and transit of boluses for all consistencies assessed.  As a result, recommend that pt's diet be advanced to regular textures and thin liquids with intermittent supervision for use of swallowing precautions.  ST will continue to follow up briefly while inpatient to ensure toleration of upgrade.   Swallow Evaluation Recommendations       SLP Diet Recommendations: Regular solids;Thin liquid   Liquid Administration via: Cup;Straw   Medication Administration: Whole meds with liquid   Supervision: Patient able to self feed   Compensations: Minimize environmental distractions;Slow rate;Small sips/bites   Postural Changes: Seated upright at 90 degrees            Faithanne Verret, Joni Reining L 01/10/2017,11:32 AM

## 2017-01-10 NOTE — Plan of Care (Signed)
Problem: RH BOWEL ELIMINATION Goal: RH STG MANAGE BOWEL W/MEDICATION W/ASSISTANCE STG Manage Bowel with Medication with min Assistance.  Outcome: Progressing No bowel issues  Problem: RH BLADDER ELIMINATION Goal: RH STG MANAGE BLADDER WITH EQUIPMENT WITH ASSISTANCE STG Manage Bladder With Equipment With Assistance  Outcome: Not Progressing Unable tu urinate, straight cath performed by staff  Problem: RH SKIN INTEGRITY Goal: RH STG SKIN FREE OF INFECTION/BREAKDOWN Outcome: Progressing No skin infection noted  Problem: RH SAFETY Goal: RH STG ADHERE TO SAFETY PRECAUTIONS W/ASSISTANCE/DEVICE STG Adhere to Safety Precautions With supervision  Assistance/Device.  Outcome: Progressing No safety issues

## 2017-01-10 NOTE — Plan of Care (Signed)
Problem: RH KNOWLEDGE DEFICIT Goal: RH STG INCREASE KNOWLEDGE OF DYSPHAGIA/FLUID INTAKE Outcome: Completed/Met Date Met: 01/10/17 Pt pass MBS today and diet advanced to Regular with thin liquids

## 2017-01-10 NOTE — Progress Notes (Signed)
[facility] PHYSICAL MEDICINE & REHABILITATION     PROGRESS NOTE  Subjective/Complaints:  Pt seen laying in bed this AM.  He slept well overnight.  He has questions regarding his progress, chest xray, and concerns about urinary retention.   ROS: Denies CP, SOB, nausea, vomiting, diarrhea.  Objective: Vital Signs: Blood pressure (!) 104/59, pulse 74, temperature 98.3 F (36.8 C), temperature source Oral, resp. rate 18, height 6' (1.829 m), weight 74.5 kg (164 lb 3.9 oz), SpO2 96 %. Dg Chest 2 View  Result Date: 01/09/2017 CLINICAL DATA:  Weakness.  History of empyema, CVA, current smoker. EXAM: CHEST  2 VIEW COMPARISON:  Portable chest x-ray of December 26, 2016 FINDINGS: The lungs are well-expanded. There is subtle increased interstitial density bilaterally. There is no alveolar infiltrate. There is a trace of blunting of the costophrenic angles bilaterally. There is stable asymmetric pleural thickening greatest on the left which is stable. The heart is normal in size. The pulmonary vascularity is not engorged. There is calcification in the wall of the aortic arch. There are old rib deformities bilaterally. IMPRESSION: Mild chronic bronchitic changes, stable. No acute pneumonia nor pulmonary edema. Tiny bilateral pleural effusions which appear new. Thoracic aortic atherosclerosis. Electronically Signed   By: Zubair  [place] M.D.   On: 01/09/2017 10:48    Recent Labs  01/09/17 0528  WBC 6.0  HGB 7.8*  HCT 25.4*  PLT 462*    Recent Labs  01/09/17 0528  NA 136  K 4.1  CL 105  GLUCOSE 90  BUN 13  CREATININE 0.96  CALCIUM 8.8*   CBG (last 3)  No results for input(s): GLUCAP in the last 72 hours.  Wt Readings from Last 3 Encounters:  01/10/17 74.5 kg (164 lb 3.9 oz)  01/08/17 75.8 kg (167 lb 1.7 oz)    Physical Exam:  BP (!) 104/59 (BP Location: Left Arm)   Pulse 74   Temp 98.3 F (36.8 C) (Oral)   Resp 18   Ht 6' (1.829 m)   Wt 74.5 kg (164 lb 3.9 oz)   SpO2 96%   BMI  22.28 kg/m  Constitutional: He appears well-developed and well-nourished. NAD.  HENT: Normocephalic and atraumatic.  Eyes: No discharge. EOM are normal.  Cardiovascular: RRR.  No JVD. Respiratory: Effort normal and breath sounds normal.  GI: Bowel sounds are normal. He exhibits no distension.  Musculoskeletal: He exhibits no edema or tenderness.  Right shoulder limited due to RTC pathology.   Neurological: He is alert and oriented.  Motor: RUE: Shoulder abduction 3-/5, distally 4/5 (rotator cuff pathology), (stable) RLE: 4/5 proximal to distal LUE/LLE: 4+/5 proximal to distal  Skin: Skin is warm and dry. Resolving ecchymosis bilateral forearms   Psychiatric: He has a normal mood and affect. His behavior is normal. Thought content normal.    Assessment/Plan: 1. Functional deficits secondary to debility which require 3+ hours per day of interdisciplinary therapy in a comprehensive inpatient rehab setting. Physiatrist is providing close team supervision and 24 hour management of active medical problems listed below. Physiatrist and rehab team continue to assess barriers to discharge/monitor patient progress toward functional and medical goals.  Function:  Bathing Bathing position   Position: Shower  Bathing parts Body parts bathed by patient: Right arm, Left arm, Chest, Abdomen, Front perineal area, Right upper leg, Left upper leg Body parts bathed by helper: Buttocks, Right lower leg, Left lower leg, Back  Bathing assist Assist Level:  (Mod assist)      Upper  Body Dressing/Undressing Upper body dressing   What is the patient wearing?: Pull over shirt/dress     Pull over shirt/dress - Perfomed by patient: Thread/unthread right sleeve, Thread/unthread left sleeve, Put head through opening Pull over shirt/dress - Perfomed by helper: Pull shirt over trunk        Upper body assist Assist Level:  (Min assist)      Lower Body Dressing/Undressing Lower body dressing   What is the  patient wearing?: Pants, Non-skid slipper socks     Pants- Performed by patient: Thread/unthread right pants leg, Thread/unthread left pants leg, Pull pants up/down     Non-skid slipper socks- Performed by helper: Don/doff right sock, Don/doff left sock                  Lower body assist Assist for lower body dressing:  (Mod assist)      Toileting Toileting          Toileting assist     Transfers Chair/bed transfer     Chair/bed transfer assist level: Touching or steadying assistance (Pt > 75%)       Locomotion Ambulation     Max distance: 15 Assist level: Moderate assist (Pt 50 - 74%)   Wheelchair   Type: Manual Max wheelchair distance: 100 Assist Level: Supervision or verbal cues  Cognition Comprehension Comprehension assist level: Follows basic conversation/direction with no assist  Expression Expression assist level: Expresses basic needs/ideas: With no assist  Social Interaction Social Interaction assist level: Interacts appropriately with others with medication or extra time (anti-anxiety, antidepressant).  Problem Solving Problem solving assist level: Solves basic 90% of the time/requires cueing < 10% of the time  Memory Memory assist level: Recognizes or recalls 75 - 89% of the time/requires cueing 10 - 24% of the time    Medical Problem List and Plan: 1.  Decreased endurance, mobility deficits, and limitation in self-care secondary to debility.   Cont CIR 2.  DVT Prophylaxis/Anticoagulation: Pharmaceutical: Lovenox 3. Pain Management: tylenol prn  4. Mood: LCSW to follow for evaluation and support.  5. Neuropsych: This patient is ?fully capable of making decisions on his own behalf. 6. Skin/Wound Care: Routine pressure relief measures 7. Fluids/Electrolytes/Nutrition: Monitor I/Os. Used to drink boost at home.  8. HTN: Monitor BP bid  Metoprolol 50 decreased to 12.5 BID on 9/14   May need to hold lasix for now. Resume home amlodipine, losartan as  needed. 9. Dysphagia:   Advanced to D3 nectar, cont to advance as tolerated 10. Alcohol abuse: continue thiamine and folic acid. 11. Tobacco abuse: Continue nicotine patch.  12. Peripheral neuropathy: Resumed low dose gabapentin at bedtime (PTA 600 mg daily at bedtime) to help with insomnia. Monitor for symptoms as activity increases.  13. Urinary retention: Removed foley and started bladder training.   Ucx NG  Will consider meds if necessary 14. Code status: Discussed-- Full Code.  15. Acute kidney injury: ? Resolved  Encourage fluid intake  Will continue to monitor due to fluids   Labs ordered for Monday 16. Anemia of chronic illness?:   Hemoglobin 7.8 on 9/13  Continue to monitor  Labs ordered for Monday 17. Bilateral pleural effusions  Per CXR, reviewed  Continue Lasix  Continue to monitor 18. Hypoalbuminemia  Supplement initiated on 9/13   LOS (Days) 2 A FACE TO FACE EVALUATION WAS PERFORMED  Declyn Delsol Karis Juba 01/10/2017 10:11 AM

## 2017-01-10 NOTE — Progress Notes (Signed)
Occupational Therapy Session Note  Patient Details  Name: Jackson Howell MRN: [id number] Date of Birth: Jan 01, 1941  Today's Date: 01/10/2017 OT Individual Time: 0820-0916 OT Individual Time Calculation (min): 56 min    Short Term Goals: Week 1:  OT Short Term Goal 1 (Week 1): Pt will complete toilet transfers with Supervision with LRAD OT Short Term Goal 2 (Week 1): Pt will complete bathing with Supervision at sit > stand level OT Short Term Goal 3 (Week 1): Pt will complete 2 grooming tasks in standing to increase activity tolerance  Skilled Therapeutic Interventions/Progress Updates:    Treatment session with focus on functional mobility and dynamic standing balance.  Ambulated to sink with RW with min guard.  Engaged in grooming tasks and changing shirt with increased time and setup assist.  Pt declined attempts to complete grooming in standing this session.  Ambulated to toilet with RW with min assist, completed toileting tasks with supervision.  Ambulated ~100 feet with RW with min guard, required seated rest break then ambulated additional 50'.  Completed 6 mins on Nustep resistance level 4 for strengthening and endurance.  Engaged in dynamic standing task incorporating mini squats and trunk rotation to simulate functional tasks.  Pt with decreased strength and limited ROM due to Rt shoulder limitations (?RTC) when tossing bean bags with RUE.  Therapy Documentation Precautions:  Precautions Precautions: Fall Restrictions Weight Bearing Restrictions: No Pain:  Pt with no c/o pain  See Function Navigator for Current Functional Status.   Therapy/Group: Individual Therapy  Rosalio Loud 01/10/2017, 10:21 AM

## 2017-01-10 NOTE — Care Management Note (Signed)
Inpatient Rehabilitation Center Individual Statement of Services  Patient Name:  Jackson Howell  Date:  01/10/2017  Welcome to the [facility]patient Rehabilitation [facility].  Our goal is to provide you with an individualized program based on your diagnosis and situation, designed to meet your specific needs.  With this comprehensive rehabilitation program, you will be expected to participate in at least 3 hours of rehabilitation therapies Monday-Friday, with modified therapy programming on the weekends.  Your rehabilitation program will include the following services:  Physical Therapy (PT), Occupational Therapy (OT), Speech Therapy (ST), 24 hour per day rehabilitation nursing, Therapeutic Recreaction (TR), Case Management (Social Worker), Rehabilitation Medicine, Nutrition Services and Pharmacy Services  Weekly team conferences will be held on Wednesdays to discuss your progress.  Your Social Worker will talk with you frequently to get your input and to update you on team discussions.  Team conferences with you and your family in attendance may also be held.  Expected length of stay: 9-13 days  Overall anticipated outcome: modified independent  Depending on your progress and recovery, your program may change. Your Social Worker will coordinate services and will keep you informed of any changes. Your Social Worker's name and contact numbers are listed  below.  The following services may also be recommended but are not provided by the Inpatient Rehabilitation Center:   Driving Evaluations  Home Health Rehabiltiation Services  Outpatient Rehabilitation Services  Vocational Rehabilitation   Arrangements will be made to provide these services after discharge if needed.  Arrangements include referral to agencies that provide these services.  Your insurance has been verified to be:  Medicare and BCBS Your primary doctor is:  Dr. Trixie Rude [place], [place]  Pertinent information will be shared  with your doctor and your insurance company.  Social Worker:  [place], [place] [phone] or [phone]   Information discussed with and copy given to patient by: Amada Jupiter, 01/10/2017, 4:13 PM

## 2017-01-10 NOTE — Plan of Care (Signed)
Problem: RH BLADDER ELIMINATION Goal: RH STG MANAGE BLADDER WITH EQUIPMENT WITH ASSISTANCE STG Manage Bladder With Equipment With Mod I Assistance  Outcome: Not Progressing Pt unable to urinate regularly, retaining urine requiring bladder scan and straight cath by staff  Problem: RH BLADDER ELIMINATION Goal: RH STG MANAGE BLADDER WITH ASSISTANCE STG Manage Bladder With Mod I Assistance Outcome: Not Progressing Pt unable to urinate regularly, retaining urine requiring bladder scan and straight cath by staff  Goal: RH STG MANAGE BLADDER WITH MEDICATION WITH ASSISTANCE STG Manage Bladder With Medication With Mod I Assistance. Outcome: Not Progressing Pt unable to urinate regularly, retaining urine requiring bladder scan and straight cath by staff. Pt started on finestaride today to assist with bladder elimination issues.  Goal: RH STG MANAGE BLADDER WITH EQUIPMENT WITH ASSISTANCE STG Manage Bladder With Equipment With Mod I Assistance Outcome: Not Progressing Pt unable to urinate regularly, retaining urine requiring bladder scan and straight cath by staff

## 2017-01-10 NOTE — Progress Notes (Signed)
Physical Therapy Session Note  Patient Details  Name: Jackson Howell MRN: [id number] Date of Birth: 31-May-1940  Today's Date: 01/10/2017 PT Individual Time: 3128-1188 PT Individual Time Calculation (min): 55 min   Short Term Goals: Week 1:  PT Short Term Goal 1 (Week 1): STG=LTG  Skilled Therapeutic Interventions/Progress Updates:   Pt supine upon arrival and agreeable to therapy, no c/o pain. Ambulated to/from therapy gym in 75' bouts w/ seated rest in between secondary to fatigue. Used RW for [employer] this session due to pt's R leg pain while ambulating w/o AD - RN made aware and present to provide pain medication. Worked on [employer] this session in gym while performing static standing balance tasks on foam w/ unilateral UE support and supervision. Practiced sit<>stands w/o hands on firm surface and w/ hands on foam surface to improve LE balance strategies. Ended session sitting EOB, bed alarm on, call bell within reach and all needs met.   Therapy Documentation Precautions:  Precautions Precautions: Fall Restrictions Weight Bearing Restrictions: No Vital Signs: Therapy Vitals Temp: 98.2 F (36.8 C) Temp Source: Oral Pulse Rate: 75 Resp: 18 BP: 110/62 Patient Position (if appropriate): Sitting Oxygen Therapy SpO2: 98 % O2 Device: Not Delivered Pain: Pain Assessment Pain Assessment: No/denies pain  See Function Navigator for Current Functional Status.   Therapy/Group: Individual Therapy  Taylah Dubiel K Arnette 01/10/2017, 4:26 PM

## 2017-01-10 NOTE — Progress Notes (Signed)
Provided patient with a copy of Medicare Privacy Act Statement - Health Records. Reviewed same with patient. Patient had no questions for this RN at this time. Lissette Schenk  [employer], RN

## 2017-01-10 NOTE — Progress Notes (Signed)
Occupational Therapy Session Note  Patient Details  Name: Jackson Howell MRN: [id number] Date of Birth: [birth year]  Today's Date: 01/10/2017 OT Individual Time: 1100-1158 OT Individual Time Calculation (min): 58 min    Short Term Goals: Week 1:  OT Short Term Goal 1 (Week 1): Pt will complete toilet transfers with Supervision with LRAD OT Short Term Goal 2 (Week 1): Pt will complete bathing with Supervision at sit > stand level OT Short Term Goal 3 (Week 1): Pt will complete 2 grooming tasks in standing to increase activity tolerance  Skilled Therapeutic Interventions/Progress Updates:    Treatment session focused on ADL/self care training, transfer training, safety awareness, balance training, energy conservation awareness, and pt education. Upon entering room, pt agreeable to AM ADLs including bathing, dressing, and grooming. Pt gathered clothes at w/c level and propelled self into bathroom. Pt completed transfer onto shower bench with min A and v/c for hand/foot placement. Pt doffed clothes with S and was set up for UB bathing tasks. Pt completed showering tasks with close guard assistance in sit<>stand levels. Pt assisted out of shower with min A . Pt completed dressing tasks at w/c level and required close guard assist for LB dressing.pt noted to struggle with getting clothes around ankles and walk instructed on different techniques. Pt completed UB dressing tasks after 2 attempts. Pt self propelled over to sink for grooming tasks after set up. Pt completed functional mobility at w/c level throughout hallway. Therapist instructed pt on how to use [employer] in family room for a cup of coffee and therapist encouraged pt to have family member of staff present with him if he wants coffee for safety. Pt returned to room via w/c with all needs met. No c/o of pain.   Therapy Documentation Precautions:  Precautions Precautions: Fall Restrictions Weight Bearing Restrictions: No General:   Vital  Signs:  Pain: Pain Assessment Pain Assessment: No/denies pain ADL:   Vision   Perception    Praxis   Exercises:   Other Treatments:    See Function Navigator for Current Functional Status.   Therapy/Group: Individual Therapy  Delon [place] 01/10/2017, 12:52 PM

## 2017-01-11 ENCOUNTER — Inpatient Hospital Stay (HOSPITAL_COMMUNITY): Payer: Medicare Other | Admitting: Occupational Therapy

## 2017-01-11 ENCOUNTER — Inpatient Hospital Stay (HOSPITAL_COMMUNITY): Payer: Medicare Other | Admitting: Physical Therapy

## 2017-01-11 ENCOUNTER — Inpatient Hospital Stay (HOSPITAL_COMMUNITY): Payer: Medicare Other | Admitting: Speech Pathology

## 2017-01-11 NOTE — Progress Notes (Signed)
Pt is alert and oriented X4. 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 This morning he was bladder scanned for 700 ml and in the process of performing In and Out cath, it was only blood that coming out. Dr. Doroteo Bradford was notified who ordered coude cath for in and out cath. Pt denies any discomfort or pain from the process. In and Out cath was done with coude cath successfully. Will continue to monitor.

## 2017-01-11 NOTE — Progress Notes (Signed)
Physical Therapy Session Note  Patient Details  Name: FED CECI MRN: [id number] Date of Birth: 1940/12/22  Today's Date: 01/11/2017 PT Individual Time: 1100-1157 PT Individual Time Calculation (min): 57 min   Short Term Goals: Week 1:  PT Short Term Goal 1 (Week 1): STG=LTG  Skilled Therapeutic Interventions/Progress Updates:   Pt in w/c upon arrival and agreeable to therapy, no c/o pain. Worked on functional mobility and independence w/ functional tasks this session while maintaining dynamic standing balance w/ and w/o UE support. Ambulated to/from ADL apartment in 75' bouts w/ seated rest secondary to fatigue. Ambulated around ADL kitchen w/ close supervision to Min guard while making and baking cookies. Pt overall required Mod A to achieve task secondary to increased LLE pain w/ standing and UE shakiness w/ fine movements. Pt requires UE support w/ dynamic standing balance and no UE support w/ static standing balance. Encouraged pt to take rests throughout baking process to prevent LOB and over-fatigue. Ended session in recliner, call bell within reach and all needs met.   Therapy Documentation Precautions:  Precautions Precautions: Fall Restrictions Weight Bearing Restrictions: No  See Function Navigator for Current Functional Status.   Therapy/Group: Individual Therapy  Icela Glymph K Arnette 01/11/2017, 11:58 AM

## 2017-01-11 NOTE — Progress Notes (Signed)
Occupational Therapy Session Note  Patient Details  Name: Jackson Howell MRN: [id number] Date of Birth: 09/27/1940  Today's Date: 01/11/2017 OT Individual Time: 4098-1191 and 1330-1415 OT Individual Time Calculation (min): 60 min and 45 min   Short Term Goals: Week 1:  OT Short Term Goal 1 (Week 1): Pt will complete toilet transfers with Supervision with LRAD OT Short Term Goal 2 (Week 1): Pt will complete bathing with Supervision at sit > stand level OT Short Term Goal 3 (Week 1): Pt will complete 2 grooming tasks in standing to increase activity tolerance  Skilled Therapeutic Interventions/Progress Updates:    1) Treatment session with focus on functional mobility with transfers and increased standing tolerance.  Pt ambulated to room shower with RW with supervision.  Completed transfer into walk-in shower with supervision and bathing with distant supervision.  Min assist when exiting shower.  Dressing completed with supervision, except requiring assist to don Rt sock.  Ambulated to family room ~100 feet with RW with supervision to make a cup of coffee.  Pt reports ~80 feet pain in Rt thigh (premorbid).  Returned to room via w/c due to pain and for energy conservation.  Pt completed all setup of breakfast tray with increased time.  2) Treatment session with focus on functional mobility and dynamic standing balance.  Pt ambulated ~100 feet with RW with supervision.  Engaged in reaching activity while standing on foam wedge to challenge balance reactions and trunk rotation.  Min guard during standing on dynamic surface.  Basketball activity in standing with progression from UE support on RW to no UE support on RW to increase balance challenge while focusing on RUE ROM.  Noted fluctuating increase in ROM when shooting basketball.  Utilized rebounder for additional balance challenge with pt alternating UE for tossing/catching with min guard to supervision for standing balance.  Returned to room  ambulating with RW with distant supervision.  Therapy Documentation Precautions:  Precautions Precautions: Fall Restrictions Weight Bearing Restrictions: No General:   Vital Signs: Therapy Vitals Temp: 98.9 F (37.2 C) Temp Source: Oral Pulse Rate: 60 Resp: 18 BP: 111/65 Patient Position (if appropriate): Lying Oxygen Therapy SpO2: 98 % O2 Device: Not Delivered Pain: Pain Assessment Pain Assessment: 0-10 Pain Score: 3  Pain Type: Acute pain Pain Location: Leg Pain Orientation: Right Pain Descriptors / Indicators: Throbbing Pain Frequency: Intermittent Pain Onset: Gradual Patients Stated Pain Goal: 0 Pain Intervention(s): Medication (See eMAR);Emotional support ADL:   Vision   Perception    Praxis   Exercises:   Other Treatments:    See Function Navigator for Current Functional Status.   Therapy/Group: Individual Therapy  Rosalio Loud 01/11/2017, 8:23 AM

## 2017-01-11 NOTE — Progress Notes (Signed)
Speech Language Pathology Daily Session Note  Patient Details  Name: Jackson Howell MRN: [id number] Date of Birth: [birth year]  Today's Date: 01/11/2017 SLP Individual Time: 0900-0930 SLP Individual Time Calculation (min): 30 min  Short Term Goals: Week 1: SLP Short Term Goal 1 (Week 1): Pt will consume therapeutic trials of thin liquids with minimal overt s/s of aspiration and mod I use of swallowing precautions.   SLP Short Term Goal 2 (Week 1): Pt will consume dys 3 textures and nectar thick liquids with minimal overt s/s of aspiration and mod I use of swallowing precautions.    Skilled Therapeutic Interventions: Skilled treatment session focused on dysphagia goals. SLP facilitated session by providing skilled observation of pt consuming regular texture snack with thin liquids. Pt with no s/s of aspiration. Pt has just finished breakfast and didn't report any s/s of aspiration. Pt appears to be tolerating current diet. Recommend discharge from dysphagia therapy on 9/17.      Function:  Eating Eating   Modified Consistency Diet: No Eating Assist Level: More than reasonable amount of time           Cognition Comprehension Comprehension assist level: Follows basic conversation/direction with no assist  Expression   Expression assist level: Expresses basic needs/ideas: With no assist  Social Interaction Social Interaction assist level: Interacts appropriately with others with medication or extra time (anti-anxiety, antidepressant).  Problem Solving Problem solving assist level: Solves basic problems with no assist  Memory Memory assist level: More than reasonable amount of time    Pain Pain Assessment Pain Assessment: 0-10 Pain Score: 3  Pain Type: Acute pain Pain Location: Leg Pain Orientation: Right Pain Descriptors / Indicators: Throbbing Pain Frequency: Intermittent Pain Onset: Gradual Patients Stated Pain Goal: 0 Pain Intervention(s): Medication (See  eMAR);Emotional support  Therapy/Group: Individual Therapy  Laquisha Northcraft 01/11/2017, 9:12 AM

## 2017-01-11 NOTE — Progress Notes (Signed)
[place] PHYSICAL MEDICINE & REHABILITATION     PROGRESS NOTE  Subjective/Complaints:  Discussed urinary retention as well as blood  during urinary catheterizations. We discussed recommendation for Foley catheter, may need coude type  ROS: Denies CP, SOB, nausea, vomiting, diarrhea.  Objective: Vital Signs: Blood pressure 111/65, pulse 60, temperature 98.9 F (37.2 C), temperature source Oral, resp. rate 18, height 6' (1.829 m), weight 75.9 kg (167 lb 6.4 oz), SpO2 98 %. Dg Swallowing Func-speech Pathology  Result Date: 01/10/2017 Objective Swallowing Evaluation: No Data Recorded Patient Details Name: Jackson Howell MRN: [id number] Date of Birth: [birth year] Today's Date: 01/10/2017 Time: No Data Recorded-No Data Recorded No Data Recorded Past Medical History: Past Medical History: Diagnosis Date . Acute kidney injury (HCC)  . Anoxic brain injury (HCC)  . Dysphagia  . H/O emphysema  . Hiatal hernia  . History of esophageal stricture   s/p dilatation X 2 . HTN (hypertension)  . Hx of vasectomy  . Insomnia  . Nocturia more than twice per night   up 4-5 time/night . Peripheral neuropathy   bilateral feet . Pneumonia  . Protein-calorie malnutrition (HCC)  . Pulseless electrical activity (HCC)  . Respiratory failure (HCC)  . RUE weakness  . Urinary hesitancy  Past Surgical History: Past Surgical History: Procedure Laterality Date . APPENDECTOMY   . COLONOSCOPY W/ BIOPSIES AND POLYPECTOMY   . ESOPHAGEAL DILATION  2016 . KNEE ARTHROSCOPY Left  . TONSILLECTOMY AND ADENOIDECTOMY   No Data Recorded No Data Recorded Assessment / Plan / Recommendation CHL IP CLINICAL IMPRESSIONS 01/10/2017 Clinical Impression Pt presents with a primarily sensory based dysphagia leading to delay in swallow initiation to the pyriforms with larger sips of both thin and thickened liquids.  Pt had intermittent penetration ranging in intensity from flash to deep of both thin and thickened liquids, all of which cleared from the airway  with minimal cuing for use of intermittent throat clear and second swallow.  Airway was kept completely clear with small, controlled sips.  Oral phase was intact with adequate containment, manipulation and transit of boluses for all consistencies assessed.  As a result, recommend that pt's diet be advanced to regular textures and thin liquids with intermittent supervision for use of swallowing precautions.  ST will continue to follow up briefly while inpatient to ensure toleration of upgrade.   SLP Visit Diagnosis Dysphagia, pharyngeal phase (R13.13) Attention and concentration deficit following -- Frontal lobe and executive function deficit following -- Impact on safety and function Mild aspiration risk   No flowsheet data found.  No flowsheet data found. CHL IP DIET RECOMMENDATION 01/10/2017 SLP Diet Recommendations Regular solids;Thin liquid Liquid Administration via Cup;Straw Medication Administration Whole meds with liquid Compensations Minimize environmental distractions;Slow rate;Small sips/bites Postural Changes Seated upright at 90 degrees   No flowsheet data found.  No flowsheet data found.  No flowsheet data found.     CHL IP ORAL PHASE 01/10/2017 Oral Phase WFL Oral - Pudding Teaspoon -- Oral - Pudding Cup -- Oral - Honey Teaspoon -- Oral - Honey Cup -- Oral - Nectar Teaspoon -- Oral - Nectar Cup -- Oral - Nectar Straw -- Oral - Thin Teaspoon -- Oral - Thin Cup -- Oral - Thin Straw -- Oral - Puree -- Oral - Mech Soft -- Oral - Regular -- Oral - Multi-Consistency -- Oral - Pill -- Oral Phase - Comment --  CHL IP PHARYNGEAL PHASE 01/10/2017 Pharyngeal Phase Impaired Pharyngeal- Pudding Teaspoon -- Pharyngeal -- Pharyngeal- Pudding Cup --  Pharyngeal -- Pharyngeal- Honey Teaspoon -- Pharyngeal -- Pharyngeal- Honey Cup -- Pharyngeal -- Pharyngeal- Nectar Teaspoon -- Pharyngeal -- Pharyngeal- Nectar Cup Delayed swallow initiation-pyriform sinuses;Penetration/Aspiration during swallow Pharyngeal Material enters  airway, CONTACTS cords and then ejected out Pharyngeal- Nectar Straw -- Pharyngeal -- Pharyngeal- Thin Teaspoon -- Pharyngeal -- Pharyngeal- Thin Cup Delayed swallow initiation-pyriform sinuses Pharyngeal -- Pharyngeal- Thin Straw Penetration/Aspiration during swallow Pharyngeal Material enters airway, remains ABOVE vocal cords and not ejected out;Material enters airway, CONTACTS cords and then ejected out Pharyngeal- Puree WFL Pharyngeal -- Pharyngeal- Mechanical Soft WFL Pharyngeal -- Pharyngeal- Regular -- Pharyngeal -- Pharyngeal- Multi-consistency -- Pharyngeal -- Pharyngeal- Pill -- Pharyngeal -- Pharyngeal Comment --  No flowsheet data found. No flowsheet data found. Page, Jackson Howell 01/10/2017, 11:26 AM               Recent Labs  01/09/17 0528  WBC 6.0  HGB 7.8*  HCT 25.4*  PLT 462*    Recent Labs  01/09/17 0528  NA 136  K 4.1  CL 105  GLUCOSE 90  BUN 13  CREATININE 0.96  CALCIUM 8.8*   CBG (last 3)  No results for input(s): GLUCAP in the last 72 hours.  Wt Readings from Last 3 Encounters:  01/10/17 75.9 kg (167 lb 6.4 oz)  01/08/17 75.8 kg (167 lb 1.7 oz)    Physical Exam:  BP 111/65 (BP Location: Left Arm)   Pulse 60   Temp 98.9 F (37.2 C) (Oral)   Resp 18   Ht 6' (1.829 m)   Wt 75.9 kg (167 lb 6.4 oz)   SpO2 98%   BMI 22.70 kg/m  Constitutional: He appears well-developed and well-nourished. NAD.  HENT: Normocephalic and atraumatic.  Eyes: No discharge. EOM are normal.  Cardiovascular: RRR.  No JVD. Respiratory: Effort normal and breath sounds normal.  GI: Bowel sounds are normal. He exhibits no distension.  Musculoskeletal: He exhibits no edema or tenderness.  Right shoulder limited due to RTC pathology.   Neurological: He is alert and oriented.  Motor: RUE: Shoulder abduction 3-/5, distally 4/5 (rotator cuff pathology), (stable) RLE: 4-/5 proximal to distal LUE/LLE: 5/5 proximal to distal  Skin: Skin is warm and dry.    Psychiatric: He has a normal mood  and affect. His behavior is normal. Thought content normal.    Assessment/Plan: 1. 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 Physiatrist is providing close team supervision and 24 hour management of active medical problems listed below. Physiatrist and rehab team continue to assess barriers to discharge/monitor patient progress toward functional and medical goals.  Function:  Bathing Bathing position   Position: Shower  Bathing parts Body parts bathed by patient: Right arm, Left arm, Chest, Abdomen, Front perineal area, Right upper leg, Left upper leg, Left lower leg, Right lower leg, Buttocks, Back Body parts bathed by helper: Back  Bathing assist Assist Level: Supervision or verbal cues, Set up      Upper Body Dressing/Undressing Upper body dressing   What is the patient wearing?: Button up shirt     Pull over shirt/dress - Perfomed by patient: Thread/unthread right sleeve, Thread/unthread left sleeve, Put head through opening Pull over shirt/dress - Perfomed by helper: Pull shirt over trunk Button up shirt - Perfomed by patient: Button/unbutton shirt, Pull shirt around back, Thread/unthread left sleeve, Thread/unthread right sleeve (zipper)  Upper body assist Assist Level: Supervision or verbal cues      Lower Body Dressing/Undressing Lower body dressing   What is the patient wearing?: Pants, Underwear, Non-skid slipper socks Underwear - Performed by patient: Thread/unthread right underwear leg, Thread/unthread left underwear leg, Pull underwear up/down   Pants- Performed by patient: Thread/unthread right pants leg, Thread/unthread left pants leg, Pull pants up/down Pants- Performed by helper: Fasten/unfasten pants Non-skid slipper socks- Performed by patient: Don/doff left sock Non-skid slipper socks-  Performed by helper: Don/doff right sock                  Lower body assist Assist for lower body dressing:  (Min assist)      Toileting Toileting   Toileting steps completed by patient: Adjust clothing prior to toileting, Performs perineal hygiene, Adjust clothing after toileting   Toileting Assistive Devices: Grab bar or rail  Toileting assist Assist level: Supervision or verbal cues   Transfers Chair/bed transfer   Chair/bed transfer method: Ambulatory Chair/bed transfer assist level: Supervision or verbal cues Chair/bed transfer assistive device: Armrests, Patent attorney     Max distance: 17' Assist level: Supervision or verbal cues   Wheelchair   Type: Manual Max wheelchair distance: 100 Assist Level: Supervision or verbal cues  Cognition Comprehension Comprehension assist level: Follows basic conversation/direction with no assist  Expression Expression assist level: Expresses basic needs/ideas: With no assist  Social Interaction Social Interaction assist level: Interacts appropriately with others with medication or extra time (anti-anxiety, antidepressant).  Problem Solving Problem solving assist level: Solves basic problems with no assist  Memory Memory assist level: More than reasonable amount of time    Medical Problem List and Plan: 1.  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   Cont CIR PT, OT 2.  DVT Prophylaxis/Anticoagulation: Pharmaceutical: Lovenox 3. Pain Management: tylenol prn  4. Mood: LCSW to follow for evaluation and support.  5. Neuropsych: This patient is ?fully capable of making decisions on his own behalf. 6. Skin/Wound Care: Routine pressure relief measures 7. Fluids/Electrolytes/Nutrition: Monitor I/Os. Used to drink boost at home.  8. HTN: Controlled 9/15 Vitals:   01/10/17 2018 01/11/17 0543   BP: 118/63 111/65  Pulse: 79 60  Resp:  18  Temp:  98.9 F (37.2 C)  SpO2:  98%    Metoprolol 50 decreased to 12.5 BID on 9/14   May need to hold lasix for now. Resume home amlodipine, losartan as needed. 9. Dysphagia:   Advanced to D3 nectar, cont to advance as tolerated 10. Alcohol abuse: continue thiamine and folic acid. 11. Tobacco abuse: Continue nicotine patch.  12. Peripheral neuropathy: Resumed low dose gabapentin at bedtime (PTA 600 mg daily at bedtime) to help with insomnia. Monitor for symptoms as activity increases.  13. Urinary retention: Removed foley and started bladder training.   Ucx NG  Because of bleeding during cath will reinsert Foley for several days, to avoid additional trauma and then have another voiding trial, we also discussed the possibility of going home with Foley and following up with urology 14. Code status: Discussed-- Full Code.  15. Acute kidney injury: ? Resolved  Encourage fluid intake  Will continue to monitor due to fluids   Labs ordered for Monday 16. Anemia of chronic illness?:   Hemoglobin 7.8 on 9/13  Continue to monitor  Labs ordered for Monday 17. Bilateral pleural effusions  Per CXR, reviewed  Continue Lasix  Continue to monitor 18. Hypoalbuminemia  Supplement initiated on 9/13   LOS (Days) 3 A FACE TO FACE EVALUATION WAS PERFORMED  Erick Colace 01/11/2017 2:14 PM

## 2017-01-12 NOTE — Progress Notes (Signed)
[place] PHYSICAL MEDICINE & REHABILITATION     PROGRESS NOTE  Subjective/Complaints:  Patient is frustrated by requiring Foley catheter, discussed urinary retention. No further bleeding noted. Per penis. No evidence of hematuria in the Foley bag. Patient does not have a leg bag and would like to have one  ROS: Denies CP, SOB, nausea, vomiting, diarrhea.  Objective: Vital Signs: Blood pressure (!) 112/58, pulse 77, temperature 98.4 F (36.9 C), temperature source Oral, resp. rate 16, height 6' (1.829 m), weight 77.1 kg (170 lb), SpO2 98 %. No results found. No results for input(s): WBC, HGB, HCT, PLT in the last 72 hours. No results for input(s): NA, K, CL, GLUCOSE, BUN, CREATININE, CALCIUM in the last 72 hours.  Invalid input(s): CO CBG (last 3)  No results for input(s): GLUCAP in the last 72 hours.  Wt Readings from Last 3 Encounters:  01/11/17 77.1 kg (170 lb)  01/08/17 75.8 kg (167 lb 1.7 oz)    Physical Exam:  BP (!) 112/58 (BP Location: Left Arm)   Pulse 77   Temp 98.4 F (36.9 C) (Oral)   Resp 16   Ht 6' (1.829 m)   Wt 77.1 kg (170 lb)   SpO2 98%   BMI 23.06 kg/m  Constitutional: He appears well-developed and well-nourished. NAD.  HENT: Normocephalic and atraumatic.  Eyes: No discharge. EOM are normal.  Cardiovascular: RRR.  No JVD. Respiratory: Effort normal and breath sounds normal.  GI: Bowel sounds are normal. He exhibits no distension.  Musculoskeletal: He exhibits no edema or tenderness.  Right shoulder limited due to RTC pathology.   Neurological: He is alert and oriented.  Motor: RUE: Shoulder abduction 3-/5, distally 4/5 (rotator cuff pathology), (stable) RLE: 4-/5 proximal to distal LUE/LLE: 5/5 proximal to distal  Skin: Skin is warm and dry.    Psychiatric: He has a normal mood and affect. His behavior is normal. Thought content normal.    Assessment/Plan: 1. 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 Physiatrist is providing close team supervision and 24 hour management of active medical problems listed below. Physiatrist and rehab team continue to assess barriers to discharge/monitor patient progress toward functional and medical goals.  Function:  Bathing Bathing position   Position: Shower  Bathing parts Body parts bathed by patient: Right arm, Left arm, Chest, Abdomen, Front perineal area, Right upper leg, Left upper leg, Left lower leg, Right lower leg, Buttocks, Back Body parts bathed by helper: Back  Bathing assist Assist Level: Supervision or verbal cues, Set up      Upper Body Dressing/Undressing Upper body dressing   What is the patient wearing?: Button up shirt     Pull over shirt/dress - Perfomed by patient: Thread/unthread right sleeve, Thread/unthread left sleeve, Put head through opening Pull over shirt/dress - Perfomed by helper: Pull shirt over trunk Button up shirt - Perfomed by patient: Button/unbutton shirt, Pull shirt around back, Thread/unthread left sleeve, Thread/unthread right sleeve (zipper)      Upper body assist Assist Level: Supervision or verbal cues      Lower Body Dressing/Undressing Lower body dressing   What is the patient wearing?: Pants, Underwear, Non-skid slipper socks Underwear - Performed by patient: Thread/unthread right underwear leg, Thread/unthread left underwear leg, Pull underwear up/down   Pants- Performed by patient: Thread/unthread right pants leg, Thread/unthread left pants leg, Pull pants up/down Pants- Performed  by helper: Fasten/unfasten pants Non-skid slipper socks- Performed by patient: Don/doff left sock Non-skid slipper socks- Performed by helper: Don/doff right sock                  Lower body assist Assist for lower body dressing:  (Min assist)      Toileting Toileting   Toileting steps  completed by patient: Adjust clothing prior to toileting, Performs perineal hygiene, Adjust clothing after toileting   Toileting Assistive Devices: Grab bar or rail  Toileting assist Assist level: Supervision or verbal cues   Transfers Chair/bed transfer   Chair/bed transfer method: Ambulatory Chair/bed transfer assist level: Supervision or verbal cues Chair/bed transfer assistive device: Armrests, Patent attorney     Max distance: 55' Assist level: Supervision or verbal cues   Wheelchair   Type: Manual Max wheelchair distance: 100 Assist Level: Supervision or verbal cues  Cognition Comprehension Comprehension assist level: Follows basic conversation/direction with no assist  Expression Expression assist level: Expresses complex ideas: With no assist  Social Interaction Social Interaction assist level: Interacts appropriately with others with medication or extra time (anti-anxiety, antidepressant).  Problem Solving Problem solving assist level: Solves basic problems with no assist  Memory Memory assist level: More than reasonable amount of time    Medical Problem List and Plan: 1.  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   Cont CIR PT, OT 2.  DVT Prophylaxis/Anticoagulation: Pharmaceutical: Lovenox 3. Pain Management: tylenol prn  4. Mood: LCSW to follow for evaluation and support.  5. Neuropsych: This patient is ?fully capable of making decisions on his own behalf. 6. Skin/Wound Care: Routine pressure relief measures 7. Fluids/Electrolytes/Nutrition: Monitor I/Os. Used to drink boost at home.  8. HTN: Controlled 9/16 Vitals:   01/11/17 2120 01/12/17 0312  BP: 129/73 (!) 112/58  Pulse: 86 77  Resp:  16  Temp:  98.4 F (36.9 C)  SpO2:  98%    Metoprolol 50 decreased to 12.5 BID on 9/14 , No evidence of bradycardia  May need to  hold lasix for now. Resume home amlodipine, losartan as needed. 9. Dysphagia:   Advanced to D3 nectar, cont to advance as tolerated 10. Alcohol abuse: continue thiamine and folic acid. 11. Tobacco abuse: Continue nicotine patch.  12. Peripheral neuropathy: Resumed low dose gabapentin at bedtime (PTA 600 mg daily at bedtime) to help with insomnia. Monitor for symptoms as activity increases.  13. Urinary retention: Removed foley and started bladder training.   Ucx NG  Because of bleeding during cath will reinsert Foley for several days, to avoid additional trauma and then have another voiding trial, we also discussed the possibility of going home with Foley and following up with urology, we will order leg bag 14. Code status: Discussed-- Full Code.  15. Acute kidney injury: ? Resolved  Encourage fluid intake  Will continue to monitor due to fluids   Labs ordered for Monday 16. Anemia of chronic illness?:   Hemoglobin 7.8 on 9/13  Continue to monitor  Labs ordered for Monday 17. Bilateral pleural effusions  Per CXR, reviewed  Continue Lasix  Continue to monitor 18. Hypoalbuminemia  Supplement initiated on 9/13   LOS (Days) 4 A FACE TO FACE EVALUATION WAS PERFORMED  Erick Colace 01/12/2017 12:45 PM

## 2017-01-13 ENCOUNTER — Ambulatory Visit (HOSPITAL_COMMUNITY): Payer: Medicare Other | Admitting: Speech Pathology

## 2017-01-13 ENCOUNTER — Inpatient Hospital Stay (HOSPITAL_COMMUNITY): Payer: Medicare Other | Admitting: Speech Pathology

## 2017-01-13 ENCOUNTER — Inpatient Hospital Stay (HOSPITAL_COMMUNITY): Payer: Self-pay | Admitting: Physical Therapy

## 2017-01-13 ENCOUNTER — Inpatient Hospital Stay (HOSPITAL_COMMUNITY): Payer: Medicare Other | Admitting: Occupational Therapy

## 2017-01-13 LAB — CBC WITH DIFFERENTIAL/PLATELET
BASOS ABS: 0 10*3/uL (ref 0.0–0.1)
Basophils Relative: 0 %
EOS ABS: 0.2 10*3/uL (ref 0.0–0.7)
Eosinophils Relative: 3 %
HCT: 26 % — ABNORMAL LOW (ref 39.0–52.0)
Hemoglobin: 8.4 g/dL — ABNORMAL LOW (ref 13.0–17.0)
LYMPHS PCT: 19 %
Lymphs Abs: 1.2 10*3/uL (ref 0.7–4.0)
MCH: 30.7 pg (ref 26.0–34.0)
MCHC: 32.3 g/dL (ref 30.0–36.0)
MCV: 94.9 fL (ref 78.0–100.0)
Monocytes Absolute: 0.6 10*3/uL (ref 0.1–1.0)
Monocytes Relative: 10 %
NEUTROS PCT: 68 %
Neutro Abs: 4.3 10*3/uL (ref 1.7–7.7)
PLATELETS: 408 10*3/uL — AB (ref 150–400)
RBC: 2.74 MIL/uL — AB (ref 4.22–5.81)
RDW: 14.7 % (ref 11.5–15.5)
WBC: 6.3 10*3/uL (ref 4.0–10.5)

## 2017-01-13 LAB — BASIC METABOLIC PANEL
Anion gap: 9 (ref 5–15)
BUN: 15 mg/dL (ref 6–20)
CO2: 22 mmol/L (ref 22–32)
CREATININE: 0.97 mg/dL (ref 0.61–1.24)
Calcium: 9 mg/dL (ref 8.9–10.3)
Chloride: 104 mmol/L (ref 101–111)
Glucose, Bld: 115 mg/dL — ABNORMAL HIGH (ref 65–99)
POTASSIUM: 4.2 mmol/L (ref 3.5–5.1)
SODIUM: 135 mmol/L (ref 135–145)

## 2017-01-13 MED ORDER — FLAVOXATE HCL 100 MG PO TABS
100.0000 mg | ORAL_TABLET | Freq: Three times a day (TID) | ORAL | Status: DC | PRN
  Administered 2017-01-13 – 2017-01-16 (×5): 100 mg via ORAL
  Filled 2017-01-13 (×6): qty 1

## 2017-01-13 NOTE — Progress Notes (Signed)
Occupational Therapy Session Note  Patient Details  Name: Jackson Howell MRN: [id number] Date of Birth: 16-Mar-1941  Today's Date: 01/13/2017 OT Individual Time: 1840-3754 OT Individual Time Calculation (min): 53 min    Short Term Goals: Week 1:  OT Short Term Goal 1 (Week 1): Pt will complete toilet transfers with Supervision with LRAD OT Short Term Goal 2 (Week 1): Pt will complete bathing with Supervision at sit > stand level OT Short Term Goal 3 (Week 1): Pt will complete 2 grooming tasks in standing to increase activity tolerance  Skilled Therapeutic Interventions/Progress Updates:    Pt greeted finishing up lunch with SLP. OT treatment session focused on standing balance, functional ambulation, and general strengthening. Addressed dynamic balance with biodex with focus on hip and ankle balance strategies. Pt then completed OTEGO exercises including Hip abduction/adduction, knee raises, and sit<>stands. 3 sets of 10. Pt then ambulated with rolling walker on tile and carpeted surfaces with OT educating pt on home safety awareness. Pt stopped to discuss medical questions with PA and tolerated static standing for ~8 mins while conversing. Pt returned to room at end of session and left seated in wc with needs met and call bell in reach.   Therapy Documentation Precautions:  Precautions Precautions: Fall Restrictions Weight Bearing Restrictions: No  See Function Navigator for Current Functional Status.   Therapy/Group: Individual Therapy  Valma Cava 01/13/2017, 2:40 PM

## 2017-01-13 NOTE — Progress Notes (Signed)
Contacted GU for consulted. Discussed patient's issues with retention, failure of voiding trial as well as hematuria from caths despite coude caths.  Dr. Clent Ridges recommended leaving catheter in for at least a week to help reduce trauma and patient to follow up in office for voiding trial next week.   Discussed above with patient as well as risks of infection with leg beg due to breaking of seal/sterile field.  He is upset about conflicting information but aware of risks--have set up patient for voiding trial with GU on 9/24 @ 8 am.

## 2017-01-13 NOTE — Progress Notes (Signed)
Speech Language Pathology Discharge Summary  Patient Details  Name: Jackson Howell MRN: [id number] Date of Birth: 11-19-1940  Today's Date: 01/13/2017 SLP Individual Time: 1300-1345 SLP Individual Time Calculation (min): 45 min   Skilled Therapeutic Interventions:  Skilled treatment session focused on dysphagia goals. SLP facilitated session by providing skilled observation of pt consuming regular lunch tray with thin liquids. Pt consumed without overt s/s of aspiration. Extensive education provided on current diet and s/s of aspiration. Pt able to return demonstrate knowledge.      Patient has met 3 of 3 long term goals.  Patient to discharge at overall Modified Independent level.    Clinical Impression/Discharge Summary:   Pt with good progress in skilled ST sessions. As a result he is consuming regular diet with thin liquids without overt s/s of aspiration and is appropriate for discharge from skilled ST.   Care Partner:  Caregiver Able to Provide Assistance: Yes     Recommendation:  None      Equipment:     Reasons for discharge: Treatment goals met   Patient/Family Agrees with Progress Made and Goals Achieved: Yes   Function:  Eating Eating   Modified Consistency Diet: No Eating Assist Level: More than reasonable amount of time           Cognition Comprehension Comprehension assist level: Follows complex conversation/direction with no assist  Expression   Expression assist level: Expresses complex ideas: With no assist  Social Interaction Social Interaction assist level: Interacts appropriately with others with medication or extra time (anti-anxiety, antidepressant).  Problem Solving Problem solving assist level: Solves basic problems with no assist  Memory Memory assist level: More than reasonable amount of time   [name] 01/13/2017, 4:02 PM

## 2017-01-13 NOTE — Progress Notes (Signed)
Occupational Therapy Session Note  Patient Details  Name: Jackson Howell MRN: [id number] Date of Birth: 1940-12-28  Today's Date: 01/13/2017 OT Individual Time: 0703-0800 OT Individual Time Calculation (min): 57 min    Short Term Goals: Week 1:  OT Short Term Goal 1 (Week 1): Pt will complete toilet transfers with Supervision with LRAD OT Short Term Goal 2 (Week 1): Pt will complete bathing with Supervision at sit > stand level OT Short Term Goal 3 (Week 1): Pt will complete 2 grooming tasks in standing to increase activity tolerance  Skilled Therapeutic Interventions/Progress Updates:    Upon entering the room, pt supine in bed awaiting therapist with no c/o pain this session. Pt agreeable to OT intervention. Pt ambulating with RW and steady assistance into bathroom. Pt required min verbal cues for safety awareness when removing clothing items. Pt seated on TTB for bathing at shower level at overall supervision level. Pt donning clothing items while seated on EOB with min A for balance with LB self care. Pt needing assistance to thread foley bag through appropriate pant leg. Pt standing at sink for grooming tasks with close supervision. Pt returning to bed with call bell and all needed items within reach upon exiting the room.   Therapy Documentation Precautions:  Precautions Precautions: Fall Restrictions Weight Bearing Restrictions: No  See Function Navigator for Current Functional Status.   Therapy/Group: Individual Therapy  Alen Bleacher 01/13/2017, 8:05 AM

## 2017-01-13 NOTE — Progress Notes (Signed)
Social Work Patient ID: Jackson Howell, male   DOB: 1940/12/27, 76 y.o.   MRN: [id number]   Spoke with pt's daughter today after talking with therapists and offered targeted d/c date of Thursday 9/20.  She had requested "as much notice as possible" as she does work f/t.  She is also aware that pt expected to d/c with foley catheter and need GU follow up as an outpatient.  Daughter asking if the GU follow up could be set up in [place] - spoke with Marissa Nestle, PA who with see if this can be done (he currently has an appt set up for GU in [place].)  Daughter also asking if pt will be allowed to drive.  I explained that this is doubtful but that I will speak with MD in the morning about this.  Continue to follow.  Jenavi Beedle, LCSW

## 2017-01-13 NOTE — Progress Notes (Signed)
[facility] PHYSICAL MEDICINE & REHABILITATION     PROGRESS NOTE  Subjective/Complaints:  Pt seen laying in bed this AM about to work with OT.  He slept well overnight.  He had his foley replaced over the weekend due to urethral trauma from I/O caths.  He would like meds to help with retention, but is slightly confused regarding home meds and management.    ROS: Denies CP, SOB, nausea, vomiting, diarrhea.  Objective: Vital Signs: Blood pressure 106/63, pulse 90, temperature 98.2 F (36.8 C), temperature source Oral, resp. rate 17, height 6' (1.829 m), weight 76.2 kg (167 lb 14.4 oz), SpO2 100 %. No results found. No results for input(s): WBC, HGB, HCT, PLT in the last 72 hours. No results for input(s): NA, K, CL, GLUCOSE, BUN, CREATININE, CALCIUM in the last 72 hours.  Invalid input(s): CO CBG (last 3)  No results for input(s): GLUCAP in the last 72 hours.  Wt Readings from Last 3 Encounters:  01/13/17 76.2 kg (167 lb 14.4 oz)  01/08/17 75.8 kg (167 lb 1.7 oz)    Physical Exam:  BP 106/63   Pulse 90   Temp 98.2 F (36.8 C) (Oral)   Resp 17   Ht 6' (1.829 m)   Wt 76.2 kg (167 lb 14.4 oz)   SpO2 100%   BMI 22.77 kg/m  Constitutional: He appears well-developed and well-nourished. NAD.  HENT: Normocephalic and atraumatic.  Eyes: No discharge. EOM are normal.  Cardiovascular: RRR.  No JVD. Respiratory: Effort normal and breath sounds normal.  GI: Bowel sounds are normal. He exhibits no distension.  Musculoskeletal: He exhibits no edema or tenderness.  Right shoulder limited due to RTC pathology.   Neurological: He is alert and oriented.  Motor: RUE: Shoulder abduction 3-/5, distally 4/5 (rotator cuff pathology), (stable) RLE: 4+/5 proximal to distal LUE/LLE: 5/5 proximal to distal  Skin: Skin is warm and dry.    Psychiatric: He has a normal mood and affect. His behavior is normal. Thought content normal.    Assessment/Plan: 1. 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 Physiatrist is providing close team supervision and 24 hour management of active medical problems listed below. Physiatrist and rehab team continue to assess barriers to discharge/monitor patient progress toward functional and medical goals.  Function:  Bathing Bathing position   Position: Shower  Bathing parts Body parts bathed by patient: Right arm, Left arm, Chest, Abdomen, Front perineal area, Right upper leg, Left upper leg, Left lower leg, Right lower leg, Buttocks, Back Body parts bathed by helper: Back  Bathing assist Assist Level: Supervision or verbal cues, Set up      Upper Body Dressing/Undressing Upper body dressing   What is the patient wearing?: Button up shirt     Pull over shirt/dress - Perfomed by patient: Thread/unthread right sleeve, Thread/unthread left sleeve, Put head through opening, Pull shirt over trunk Pull over shirt/dress - Perfomed by helper: Pull shirt over trunk Button up shirt - Perfomed by patient: Button/unbutton shirt, Pull shirt around back, Thread/unthread left sleeve, Thread/unthread right sleeve (zipper)      Upper body assist Assist Level: Supervision or verbal cues      Lower Body Dressing/Undressing Lower body dressing   What is the patient wearing?: Pants, Underwear, Non-skid slipper socks Underwear - Performed by patient: Thread/unthread right underwear leg, Thread/unthread left underwear leg, Pull underwear  up/down   Pants- Performed by patient: Thread/unthread right pants leg, Thread/unthread left pants leg, Pull pants up/down Pants- Performed by helper: Fasten/unfasten pants Non-skid slipper socks- Performed by patient: Don/doff right sock, Don/doff left sock Non-skid slipper socks- Performed by helper: Don/doff right sock                  Lower body assist  Assist for lower body dressing: Touching or steadying assistance (Pt > 75%)      Toileting Toileting   Toileting steps completed by patient: Adjust clothing prior to toileting   Toileting Assistive Devices: Grab bar or rail  Toileting assist Assist level: Supervision or verbal cues   Transfers Chair/bed transfer   Chair/bed transfer method: Ambulatory Chair/bed transfer assist level: Supervision or verbal cues Chair/bed transfer assistive device: Armrests, Patent attorney     Max distance: 57' Assist level: Supervision or verbal cues   Wheelchair   Type: Manual Max wheelchair distance: 100 Assist Level: Supervision or verbal cues  Cognition Comprehension Comprehension assist level: Follows complex conversation/direction with no assist  Expression Expression assist level: Expresses complex ideas: With no assist  Social Interaction Social Interaction assist level: Interacts appropriately with others with medication or extra time (anti-anxiety, antidepressant).  Problem Solving Problem solving assist level: Solves basic problems with no assist  Memory Memory assist level: More than reasonable amount of time    Medical Problem List and Plan: 1.  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   Cont CIR  2.  DVT Prophylaxis/Anticoagulation: Pharmaceutical: Lovenox 3. Pain Management: tylenol prn  4. Mood: LCSW to follow for evaluation and support.  5. Neuropsych: This patient is ?fully capable of making decisions on his own behalf. 6. Skin/Wound Care: Routine pressure relief measures 7. Fluids/Electrolytes/Nutrition: Monitor I/Os. Used to drink boost at home.  8. HTN:  Vitals:   01/12/17 1430 01/12/17 2030  BP: (!) 118/57 106/63  Pulse: 71 90  Resp: 17   Temp: 98.2 F (36.8 C)   SpO2: 100%    Metoprolol 50 decreased to 12.5 BID on 9/14    Controlled 9/17  Resume home amlodipine, losartan as needed. 9. Dysphagia: Resolved  Advanced to regular diet 10. Alcohol abuse: continue thiamine and folic acid. 11. Tobacco abuse: Continue nicotine patch.  12. Peripheral neuropathy: Resumed low dose gabapentin at bedtime (PTA 600 mg daily at bedtime) to help with insomnia. Monitor for symptoms as activity increases.  13. Urinary retention:   Ucx NG  Due to traumatic I/O caths, reinserted Foley and consider voiding trial in future 14. Code status: Discussed-- Full Code.  15. Acute kidney injury: ?Resolved  Encourage fluid intake  Will continue to monitor due to fluids   Labs pending 16. Anemia of chronic illness?:   Hemoglobin 7.8 on 9/13  Continue to monitor  Labs pending 17. Bilateral pleural effusions  Per CXR, reviewed  Continue Lasix  Continue to monitor 18. Hypoalbuminemia  Supplement initiated on 9/13  LOS (Days) 5 A FACE TO FACE EVALUATION WAS PERFORMED  Ladavia Lindenbaum Karis Juba 01/13/2017 9:34 AM

## 2017-01-13 NOTE — Progress Notes (Signed)
Physical Therapy Note  Patient Details  Name: Jackson Howell MRN: [id number] Date of Birth: 12-30-1940 Today's Date: 01/13/2017    Time: [phone] 56 minutes  1:1 Pt c/o pain in Rt LE with wt bearing, eases with rest.  Pt provided with k pad after session.  Gait throughout unit on carpet and tile surfaces with close supervision 150' x 3.  Supine bridging and LTR to ease back/hip pain with some relief reported.  Pt encouraged to perform bridge and LTR before getting out of bed each morning.  Otago A and B exercises for strengthening and balance with frequent rest, min A for tandem stance and sideways gait.  Nustep for LE/UE strengthening level 4 with rests every 2 minutes.  Pt improving endurance and strength.   Brantleigh Mifflin 01/13/2017, 10:28 AM

## 2017-01-13 NOTE — Plan of Care (Signed)
Problem: RH BLADDER ELIMINATION Goal: RH STG MANAGE BLADDER WITH EQUIPMENT WITH ASSISTANCE STG Manage Bladder With Equipment With Mod I Assistance   Outcome: Not Progressing Total assist d/t urinary retention. Pt has foley catheter in place.

## 2017-01-14 ENCOUNTER — Inpatient Hospital Stay (HOSPITAL_COMMUNITY): Payer: Medicare Other | Admitting: Physical Therapy

## 2017-01-14 ENCOUNTER — Inpatient Hospital Stay (HOSPITAL_COMMUNITY): Payer: Medicare Other | Admitting: Occupational Therapy

## 2017-01-14 LAB — URINALYSIS, ROUTINE W REFLEX MICROSCOPIC
Bilirubin Urine: NEGATIVE
Glucose, UA: NEGATIVE mg/dL
HGB URINE DIPSTICK: NEGATIVE
Ketones, ur: NEGATIVE mg/dL
Nitrite: NEGATIVE
PH: 7 (ref 5.0–8.0)
PROTEIN: NEGATIVE mg/dL
Specific Gravity, Urine: 1.009 (ref 1.005–1.030)
Squamous Epithelial / LPF: NONE SEEN

## 2017-01-14 NOTE — Progress Notes (Signed)
Physical Therapy Session Note  Patient Details  Name: Jackson Howell MRN: [id number] Date of Birth: 04-25-1941  Today's Date: 01/14/2017 PT Individual Time: 1100-1130 PT Individual Time Calculation (min): 30 min   Short Term Goals: Week 1:  PT Short Term Goal 1 (Week 1): STG=LTG  Skilled Therapeutic Interventions/Progress Updates:   Pt in w/c upon arrival and agreeable to therapy, no c/o pain, worked on endurance this session. Ambulated to/from day room w/ supervision using RW, 100' each way. NuStep @ L3 for 5 min x3 to work on endurance and functional LE strength. Rest secondary to fatigue. Returned to room and ended session in w/c, call bell within reach and all needs met.   Therapy Documentation Precautions:  Precautions Precautions: Fall Restrictions Weight Bearing Restrictions: No  See Function Navigator for Current Functional Status.   Therapy/Group: Individual Therapy  Arvella Massingale K Arnette 01/14/2017, 12:02 PM

## 2017-01-14 NOTE — Progress Notes (Signed)
Physical Therapy Session Note  Patient Details  Name: Jackson Howell MRN: [id number] Date of Birth: 1941/02/26  Today's Date: 01/14/2017 PT Individual Time: 1345-1410 PT Individual Time Calculation (min): 25 min   Pt c/o no pain. Ready and willing for therapy. Pt propelled self in [facility] utilizing LE from room to Rehab gym. Pt transferred from [facility] to table I with supervision. Pt assumed quadriped position I with supervision  3x10 cat cow to stretch back.   3x10 quadriped with alternating arm reaches, R arm can not elevate as much as L.   2x10 quadriped with alternating leg lifts, 1x5; unable to complete due to pain in R leg, pt was unable to fully lift leg off table due to gross hip weakness, performed more of a kick back.  Pt ambulated with RW I with supervision 110 ft, reciprocal gait pattern. Pt was left in WC and call bell in reach.  Therapy/Group: Individual Therapy  Mikell Kazlauskas 01/14/2017, 2:23 PM

## 2017-01-14 NOTE — Progress Notes (Signed)
Physical Therapy Session Note  Patient Details  Name: Jackson Howell MRN: [id number] Date of Birth: [birth year]  Today's Date: 01/14/2017 PT Individual Time: 8:30-9:25 55 min    Skilled Therapeutic Interventions/Progress Updates:  Pt c/o of minimal pain in right leg, mostly only when he was sleeping. Pt was agreeable to therapy. Pt performed dynamic warm up on  nustep level 4 for 5 min. Pt performed Berg Balance scale with a total score of 36. Pt ambulated with RW 193f to gym. Pt laid in hook line position for 2 min, said"relieved his back pain",  supine trunk rotations for stretching of the lower back. Supine bridges 3x10 to aid in strengthening core and glute muscle to better aid in sit<>stands as well as relieving back pain while in bed. Sit<>stands with no hands, 3x5 I with supervision, mat was raised sligtly . Pt ambulated with RW with supervision 150 ft to make coffee, then 100 ft back to room. Pt was educated on use of walker bag to transport items while utilizing RW at the same time. Pt was left in chair with call bell and all needs met       Balance: Standardized Balance Assessment Standardized Balance Assessment: Berg Balance Test Berg Balance Test Sit to Stand: Able to stand  independently using hands Standing Unsupported: Able to stand 2 minutes with supervision Sitting with Back Unsupported but Feet Supported on Floor or Stool: Able to sit safely and securely 2 minutes Stand to Sit: Controls descent by using hands Transfers: Able to transfer safely, definite need of hands Standing Unsupported with Eyes Closed: Able to stand 10 seconds with supervision Standing Ubsupported with Feet Together: Able to place feet together independently and stand for 1 minute with supervision From Standing, Reach Forward with Outstretched Arm: Can reach forward >5 cm safely (2") From Standing Position, Pick up Object from Floor: Able to pick up shoe, needs supervision From Standing Position,  Turn to Look Behind Over each Shoulder: Turn sideways only but maintains balance Turn 360 Degrees: Able to turn 360 degrees safely but slowly Standing Unsupported, Alternately Place Feet on Step/Stool: Able to stand independently and complete 8 steps >20 seconds Standing Unsupported, One Foot in Front: Needs help to step but can hold 15 seconds Standing on One Leg: Tries to lift leg/unable to hold 3 seconds but remains standing independently Total Score: 36 Exercises:   Other Treatments:     See Function Navigator for Current Functional Status.   Therapy/Group: Individual Therapy  Trini Soldo 01/14/2017, 12:05 PM

## 2017-01-14 NOTE — Progress Notes (Signed)
Occupational Therapy Session Note  Patient Details  Name: Jackson Howell MRN: [id number] Date of Birth: Nov 11, 1940  Today's Date: 01/14/2017 OT Individual Time: 1432-1500 OT Individual Time Calculation (min): 28 min    Short Term Goals: Week 1:  OT Short Term Goal 1 (Week 1): Pt will complete toilet transfers with Supervision with LRAD OT Short Term Goal 2 (Week 1): Pt will complete bathing with Supervision at sit > stand level OT Short Term Goal 3 (Week 1): Pt will complete 2 grooming tasks in standing to increase activity tolerance  Skilled Therapeutic Interventions/Progress Updates:    Treatment focus on dynamic standing balance and activity tolerance to promote improvement of self-care/daily tasks in preparation for d/c. Pt no c/o pain. Pt ambulated ~100' with use of RW and supervision. Engaged in ball toss activity while standing on foam pad to challenge standing balance on uneven surfaces. Therapist had pt cross midline in multiple planes, and promoted reaching overhead to improve RUE active range of motion. Therapist challenged pt to complete exercises during ball toss activity which included: BUE shoulder flexion x10 and squats of 2 sets x10 requiring rest breaks between sets due to chronic pain in RLE. Pt completed activity without use of RW with therapist providing close supervision for pt safety. Pt demonstrated moments of LOB; however, pt was able to use balance strategies in order to correct LOB. Pt returned to room in w/c with all needs in reach.   Therapy Documentation Precautions:  Precautions Precautions: Fall Restrictions Weight Bearing Restrictions: No General:   Vital Signs: Therapy Vitals Temp: 98.6 F (37 C) Temp Source: Oral Pulse Rate: 89 Resp: 20 BP: (!) 105/50 Patient Position (if appropriate): Sitting Oxygen Therapy SpO2: 99 % O2 Device: Not Delivered  See Function Navigator for Current Functional Status.   Therapy/Group: Individual  Therapy  Forde Dandy 01/14/2017, 3:16 PM

## 2017-01-14 NOTE — Progress Notes (Signed)
Occupational Therapy Session Note  Patient Details  Name: Jackson Howell MRN: [id number] Date of Birth: 1941/04/18  Today's Date: 01/14/2017 OT Individual Time: 4098-1191 OT Individual Time Calculation (min): 58 min    Short Term Goals: Week 1:  OT Short Term Goal 1 (Week 1): Pt will complete toilet transfers with Supervision with LRAD OT Short Term Goal 2 (Week 1): Pt will complete bathing with Supervision at sit > stand level OT Short Term Goal 3 (Week 1): Pt will complete 2 grooming tasks in standing to increase activity tolerance   Skilled Therapeutic Interventions/Progress Updates:    Treatment focus on functional mobility, transfers, and home making tasks to promote independence in preparation for d/c. Pt with no c/o recent pain but reporting chronic pain in RLE. Pt ambulated over 150' to and from apartment with use of RW and close supervision. Engaged in tasks in hospital apartment to simulate at home daily tasks that pt completed prior to admission. Simulated laundry task folding and hanging items with pt education provided on transferring items with use of RW. Engaged in kitchen task crossing midline with BUE in multiple planes with use of RW and kitchen table top as needed. Therapist providing close supervision and verbal cues as needed throughout session for safety and cath management with use of RW. Pt completed simulated walk in shower transfer with use of RW and shower chair with supervision and demonstration for technique. Pt demonstrated carryover of transfer technique reporting confidence for when he goes home. Pt completed toilet transfer with use of RW and supervision provided by therapist for safety. Pt left in w/c with all needs in reach.  Therapy Documentation Precautions:  Precautions Precautions: Fall Restrictions Weight Bearing Restrictions: No  See Function Navigator for Current Functional Status.   Therapy/Group: Individual Therapy  Forde Dandy 01/14/2017, 12:26 PM

## 2017-01-14 NOTE — Progress Notes (Signed)
Recreational Therapy Session Note  Patient Details  Name: Jackson Howell MRN: [id number] Date of Birth: 1940-10-09 Today's Date: 01/14/2017  TR eval deferred as pt is to discharge home on 9/20.  Will continue to monitor through team. Cole Eastridge 01/14/2017, 1:21 PM

## 2017-01-14 NOTE — Progress Notes (Signed)
[place] PHYSICAL MEDICINE & REHABILITATION     PROGRESS NOTE  Subjective/Complaints:  Pt seen laying in bed this AM.  He slept well overnight.  He states he feels fine, but is frustrated and perseverative on his urinary retention.    ROS: +Urinary retention. Denies CP, SOB, nausea, vomiting, diarrhea.  Objective: Vital Signs: Blood pressure (!) 152/72, pulse 78, temperature 98.4 F (36.9 C), temperature source Oral, resp. rate 18, height 6' (1.829 m), weight 76.2 kg (167 lb 14.4 oz), SpO2 98 %. No results found.  Recent Labs  01/13/17 1028  WBC 6.3  HGB 8.4*  HCT 26.0*  PLT 408*    Recent Labs  01/13/17 1028  NA 135  K 4.2  CL 104  GLUCOSE 115*  BUN 15  CREATININE 0.97  CALCIUM 9.0   CBG (last 3)  No results for input(s): GLUCAP in the last 72 hours.  Wt Readings from Last 3 Encounters:  01/13/17 76.2 kg (167 lb 14.4 oz)  01/08/17 75.8 kg (167 lb 1.7 oz)    Physical Exam:  BP (!) 152/72 (BP Location: Left Wrist)   Pulse 78   Temp 98.4 F (36.9 C) (Oral)   Resp 18   Ht 6' (1.829 m)   Wt 76.2 kg (167 lb 14.4 oz)   SpO2 98%   BMI 22.77 kg/m  Constitutional: He appears well-developed and well-nourished. NAD.  HENT: Normocephalic and atraumatic.  Eyes: No discharge. EOM are normal.  Cardiovascular: RRR.  No JVD. Respiratory: Effort normal and breath sounds normal.  GI: Bowel sounds are normal. He exhibits no distension.  Musculoskeletal: He exhibits no edema or tenderness.  Right shoulder limited due to RTC pathology.   Neurological: He is alert and oriented.  Motor: RUE: Shoulder abduction 3-/5, distally 4/5 (rotator cuff pathology), (stable) RLE: 4+/5 proximal to distal (improving) LUE/LLE: 5/5 proximal to distal  Skin: Skin is warm and dry.    Psychiatric: He has a normal mood and affect. His behavior is normal. Thought content normal.    Assessment/Plan: 1. 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 Physiatrist is providing close team supervision and 24 hour management of active medical problems listed below. Physiatrist and rehab team continue to assess barriers to discharge/monitor patient progress toward functional and medical goals.  Function:  Bathing Bathing position   Position: Shower  Bathing parts Body parts bathed by patient: Right arm, Left arm, Chest, Abdomen, Front perineal area, Right upper leg, Left upper leg, Left lower leg, Right lower leg, Buttocks, Back Body parts bathed by helper: Back  Bathing assist Assist Level: Supervision or verbal cues, Set up      Upper Body Dressing/Undressing Upper body dressing   What is the patient wearing?: Button up shirt     Pull over shirt/dress - Perfomed by patient: Thread/unthread right sleeve, Thread/unthread left sleeve, Put head through opening, Pull shirt over trunk Pull over shirt/dress - Perfomed by helper: Pull shirt over trunk Button up shirt - Perfomed by patient: Button/unbutton shirt, Pull shirt around back, Thread/unthread left sleeve, Thread/unthread right sleeve (zipper)      Upper body assist Assist Level: Supervision or verbal cues      Lower Body Dressing/Undressing Lower body dressing   What is the patient wearing?: Pants, Underwear, Non-skid slipper socks Underwear - Performed by patient: Thread/unthread right underwear leg, Thread/unthread left underwear leg, Pull  underwear up/down   Pants- Performed by patient: Thread/unthread right pants leg, Thread/unthread left pants leg, Pull pants up/down Pants- Performed by helper: Fasten/unfasten pants Non-skid slipper socks- Performed by patient: Don/doff right sock, Don/doff left sock Non-skid slipper socks- Performed by helper: Don/doff right sock                  Lower body assist Assist for lower body dressing: Touching or  steadying assistance (Pt > 75%)      Toileting Toileting Toileting activity did not occur: No continent bowel/bladder event (pt requiring foley. No bm today) Toileting steps completed by patient: Adjust clothing prior to toileting   Toileting Assistive Devices: Grab bar or rail  Toileting assist Assist level: Supervision or verbal cues   Transfers Chair/bed transfer   Chair/bed transfer method: Ambulatory Chair/bed transfer assist level: Supervision or verbal cues Chair/bed transfer assistive device: Armrests, Patent attorney     Max distance: 50' Assist level: Supervision or verbal cues   Wheelchair   Type: Manual Max wheelchair distance: 100 Assist Level: Supervision or verbal cues  Cognition Comprehension Comprehension assist level: Follows complex conversation/direction with no assist  Expression Expression assist level: Expresses complex ideas: With no assist  Social Interaction Social Interaction assist level: Interacts appropriately with others with medication or extra time (anti-anxiety, antidepressant).  Problem Solving Problem solving assist level: Solves basic problems with no assist  Memory Memory assist level: More than reasonable amount of time    Medical Problem List and Plan: 1.  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   Cont CIR  2.  DVT Prophylaxis/Anticoagulation: Pharmaceutical: Lovenox 3. Pain Management: tylenol prn  4. Mood: LCSW to follow for evaluation and support.  5. Neuropsych: This patient is fully capable of making decisions on his own behalf. 6. Skin/Wound Care: Routine pressure relief measures 7. Fluids/Electrolytes/Nutrition: Monitor I/Os. Used to drink boost at home.  8. HTN:  Vitals:   01/13/17 1900 01/14/17 0600  BP: 127/69 (!) 152/72  Pulse: 86 78  Resp: 17 18  Temp: 98.5 F (36.9 C) 98.4 F  (36.9 C)  SpO2: 100% 98%   Metoprolol 50 decreased to 12.5 BID on 9/14   Controlled 9/18  Resume home amlodipine, losartan as needed. 9. Dysphagia: Resolved  Advanced to regular diet 10. Alcohol abuse: continue thiamine and folic acid. 11. Tobacco abuse: Continue nicotine patch.  12. Peripheral neuropathy: Resumed low dose gabapentin at bedtime (PTA 600 mg daily at bedtime) to help with insomnia. Monitor for symptoms as activity increases.  13. Urinary retention:   Ucx NG, repeat UA equivocal 9/18, Ucx pending  Due to traumatic I/O caths, reinserted Foley and consider voiding trial in future  Urology consulted, await recs 14. Code status: Discussed-- Full Code.  15. Acute kidney injury: ?Resolved  Encourage fluid intake  Will continue to monitor due to fluids  16. Anemia of chronic illness?:   Hemoglobin 8.4 on 9/17  Continue to monitor 17. Bilateral pleural effusions  Per CXR, reviewed  Continue Lasix  Continue to monitor 18. Hypoalbuminemia  Supplement initiated on 9/13  LOS (Days) 6 A FACE TO FACE EVALUATION WAS PERFORMED  Lanasia Porras Karis Juba 01/14/2017 8:04 AM

## 2017-01-15 ENCOUNTER — Inpatient Hospital Stay (HOSPITAL_COMMUNITY): Payer: Medicare Other | Admitting: Physical Therapy

## 2017-01-15 ENCOUNTER — Inpatient Hospital Stay (HOSPITAL_COMMUNITY): Payer: Medicare Other | Admitting: Occupational Therapy

## 2017-01-15 ENCOUNTER — Inpatient Hospital Stay (HOSPITAL_COMMUNITY): Payer: Medicare Other

## 2017-01-15 DIAGNOSIS — B952 Enterococcus as the cause of diseases classified elsewhere: Secondary | ICD-10-CM | POA: Insufficient documentation

## 2017-01-15 DIAGNOSIS — N390 Urinary tract infection, site not specified: Secondary | ICD-10-CM

## 2017-01-15 MED ORDER — NITROFURANTOIN MONOHYD MACRO 100 MG PO CAPS
100.0000 mg | ORAL_CAPSULE | Freq: Two times a day (BID) | ORAL | Status: DC
  Administered 2017-01-15 – 2017-01-16 (×2): 100 mg via ORAL
  Filled 2017-01-15 (×2): qty 1

## 2017-01-15 MED ORDER — NITROFURANTOIN MONOHYD MACRO 100 MG PO CAPS: 100.0000 mg | ORAL_CAPSULE | Freq: Two times a day (BID) | ORAL | Status: DC

## 2017-01-15 NOTE — Plan of Care (Signed)
Problem: RH BLADDER ELIMINATION Goal: RH STG MANAGE BLADDER WITH EQUIPMENT WITH ASSISTANCE STG Manage Bladder With Equipment With Mod I Assistance   Outcome: Not Progressing   01/15/17 1802  Bladder Management Goals  STG: Pt will manage bladder with equipment with assistance Other (Comment)   Total assist requiring I+O cath.  Problem: RH BLADDER ELIMINATION Goal: RH STG MANAGE BLADDER WITH ASSISTANCE STG Manage Bladder With Mod I Assistance  Outcome: Not Progressing Total assist requiring I+O cath.  Goal: RH STG MANAGE BLADDER WITH EQUIPMENT WITH ASSISTANCE STG Manage Bladder With Equipment With Mod I Assistance  Outcome: Not Progressing  01/15/17 1802  Bladder Management Goals  STG: Pt will manage bladder with equipment with assistance 1-Total assistance

## 2017-01-15 NOTE — Progress Notes (Cosign Needed)
Occupational Therapy Session Note  Patient Details  Name: Jackson Howell MRN: [id number] Date of Birth: [birth year]  Today's Date: 01/15/2017 OT Individual Time: 0732-0830 OT Individual Time Calculation (min): 58 min    Short Term Goals: Week 1:  OT Short Term Goal 1 (Week 1): Pt will complete toilet transfers with Supervision with LRAD OT Short Term Goal 2 (Week 1): Pt will complete bathing with Supervision at sit > stand level OT Short Term Goal 3 (Week 1): Pt will complete 2 grooming tasks in standing to increase activity tolerance  Skilled Therapeutic Interventions/Progress Updates:    Treatment focus on self-care/ADLs and homemaking tasks in preparation for d/c. Pt with no c/o pain and agreeable to therapy. Engaged in self-care tasks including: bathing, dressing, and grooming with pt able to complete mod I with use of RW for support. Pt able to ambulate ~100' with RW to complete homemaking task (making coffee) with mod I. Engaged in pt education of safety awareness and RW management in preparation for d/c home due to decreased frustration tolerance and impulsivity. Therapist educated pt on use of RW bag when transporting items around the home and placement of RW when performing tasks to promote safety awareness with pt demonstrating understanding of education. Pt left in w/c with all needs met.  Therapy Documentation Precautions:  Precautions Precautions: Fall Restrictions Weight Bearing Restrictions: No Vital Signs: Therapy Vitals Temp: 98.4 F (36.9 C) Temp Source: Oral Pulse Rate: 83 Resp: 20 BP: (!) 143/75 Patient Position (if appropriate): Lying Oxygen Therapy SpO2: 98 % O2 Device: Not Delivered  See Function Navigator for Current Functional Status.   Therapy/Group: Individual Therapy  Hoyt Koch 01/15/2017, 8:56 AM

## 2017-01-15 NOTE — Progress Notes (Signed)
Occupational Therapy Discharge Summary  Patient Details  Name: Jackson Howell MRN: [id number] Date of Birth: Mar 16, 1941    Patient has met 10 of 11 long term goals due to improved activity tolerance, improved balance, ability to compensate for deficits and improved awareness.  Patient to discharge at overall Modified Independent level.  Patient's care partner is independent to provide the necessary intermittent assistance at discharge.  Patient to d/c to his daughter's home, however she works during the day.  Patient is Mod I with the RW, however continues to demonstrate impulsivity with movements but is overall safe.  Reasons goals not met: Pt will require supervision/setup for laundry tasks as he will not be able to transport laundry items while maintaining safety with RW.  Recommendation:  Patient will benefit from ongoing skilled OT services in home health setting to continue to advance functional skills in the area of BADL, iADL and Reduce care partner burden.  Equipment: shower chair  Reasons for discharge: treatment goals met and discharge from hospital  Patient/family agrees with progress made and goals achieved: Yes  OT Discharge Precautions/Restrictions  Precautions Precautions: Fall Restrictions Weight Bearing Restrictions: No Vital Signs Therapy Vitals Temp: 97.8 F (36.6 C) Temp Source: Oral Pulse Rate: 82 Resp: 18 BP: 109/64 Patient Position (if appropriate): Sitting Oxygen Therapy SpO2: 100 % O2 Device: Not Delivered Pain Pain Assessment Pain Assessment: No/denies pain Pain Score: 0-No pain ADL  See Function Navigator Vision Baseline Vision/History: Wears glasses Wears Glasses: Reading only Patient Visual Report: No change from baseline Vision Assessment?: No apparent visual deficits Perception  Perception: Within Functional Limits Praxis Praxis: Intact Cognition Overall Cognitive Status: Within Functional Limits for tasks  assessed Arousal/Alertness: Awake/alert Orientation Level: Oriented X4 Attention: Alternating Alternating Attention: Appears intact Memory: Impaired Memory Impairment: Decreased recall of new information Awareness: Appears intact Problem Solving: Appears intact Behaviors: Impulsive Safety/Judgment: Appears intact Sensation Sensation Light Touch: Appears Intact Hot/Cold: Appears Intact Proprioception: Appears Intact Coordination Gross Motor Movements are Fluid and Coordinated: No Fine Motor Movements are Fluid and Coordinated: Yes Coordination and Movement Description: Limited R shoulder ROM Heel Shin Test: good range; uncoordinated movements, not able to keep heel against shin during movement Motor  Motor Motor: Other (comment) Motor - Discharge Observations: generalized weakness  Trunk/Postural Assessment  Cervical Assessment Cervical Assessment:  (forward head) Thoracic Assessment Thoracic Assessment:  (mild kyphosis) Lumbar Assessment Lumbar Assessment:  (posterior pelvic tilt) Postural Control Postural Control: Within Functional Limits  Balance Balance Balance Assessed: Yes Standardized Balance Assessment Standardized Balance Assessment: Timed Up and Go Test Timed Up and Go Test TUG: Normal TUG Normal TUG (seconds): 15.8 Static Standing Balance Static Standing - Balance Support: Bilateral upper extremity supported Static Standing - Level of Assistance: 6: Modified independent (Device/Increase time) Dynamic Standing Balance Dynamic Standing - Balance Support: Bilateral upper extremity supported Dynamic Standing - Level of Assistance: 6: Modified independent (Device/Increase time) Extremity/Trunk Assessment RUE Assessment RUE Assessment: Exceptions to [facility] RUE AROM (degrees) RUE Overall AROM Comments: Limited ROM in R shoulder; shoulder flexion limited to ~40 deg. ?RTC injury/impingement RUE Strength RUE Overall Strength Comments: RUE overall weaker compared to  LUE LUE Assessment LUE Assessment: Within Functional Limits   See Function Navigator for Current Functional Status.  Hoyt Koch 01/15/2017, 3:32 PM

## 2017-01-15 NOTE — Progress Notes (Signed)
Occupational Therapy Session Note  Patient Details  Name: Jackson Howell MRN: [id number] Date of Birth: 08/30/1940  Today's Date: 01/15/2017 OT Individual Time: 1400-1429 OT Individual Time Calculation (min): 29 min    Short Term Goals: Week 1:  OT Short Term Goal 1 (Week 1): Pt will complete toilet transfers with Supervision with LRAD OT Short Term Goal 2 (Week 1): Pt will complete bathing with Supervision at sit > stand level OT Short Term Goal 3 (Week 1): Pt will complete 2 grooming tasks in standing to increase activity tolerance  Skilled Therapeutic Interventions/Progress Updates:    Upon entering the room, pt seated in wheelchair awaiting therapist. Pt ambulating with RW to family room at mod I level. Pt engaged in functional task of making coffee with increased time at mod I level. OT reviewed safety strategies for kitchen mobility in home environment. Pt verbalized understanding. Pt returning to room in same manner as above. Pt with several questions regarding foley and OT directed them to RN. Pt seated in wheelchair with call bell and all needed items within reach upon exiting the room.   Therapy Documentation Precautions:  Precautions Precautions: Fall Restrictions Weight Bearing Restrictions: No General:   Vital Signs: Therapy Vitals Temp: 97.8 F (36.6 C) Temp Source: Oral Pulse Rate: 82 Resp: 18 BP: 109/64 Patient Position (if appropriate): Sitting Oxygen Therapy SpO2: 100 % O2 Device: Not Delivered Pain: Pain Assessment Pain Assessment: No/denies pain Pain Score: 0-No pain ADL:   Vision Baseline Vision/History: Wears glasses Wears Glasses: Reading only Patient Visual Report: No change from baseline Vision Assessment?: No apparent visual deficits Perception  Perception: Within Functional Limits Praxis Praxis: Intact Exercises:   Other Treatments:    See Function Navigator for Current Functional Status.   Therapy/Group: Individual  Therapy  Alen Bleacher 01/15/2017, 4:18 PM

## 2017-01-15 NOTE — Progress Notes (Addendum)
Physical Therapy Note  Patient Details  Name: Jackson Howell MRN: [id number] Date of Birth: 1941/03/27 Today's Date: 01/15/2017  1300-1400, 60 min individual tx Pain: 2/10 R thigh, declined meds  W/c propulsion for activity tolerance using bil UEs and bil LEs x 150' iwht supervision.  Stand pivot transfer modified independent.  Neuro re-ed via forced use, multmodal cues for sustained stretch bil heel cords x 1 min, x 1.5 min, with bil UE support fading to 0 support. Gait over level tile with RW, gait speed 3.28'/sec over level ground.  With external perturbations, pt exhibited bil ankle strategy and bil hip strategy. Standing [place] exs with bil UE support for mini squats in modified tandem stance R/L , heel/toe raises, R/L hip abduction.  Standing step/taps onto foam bolster x 10 with greater difficulty with R SLS/ L foot placement. Braiding x 15' R/L with mod assist for balance.  Therapeutic activity ambulating with/without RW kicking Yoga block with alternating feet; wihtout LOB but pt had difficulty with upright trunk as he fatigued and R thigh became painful. Pt left resting in w/c with all needs within reach.  See function navigator for current status.  Anaston Koehn 01/15/2017, 1:17 PM

## 2017-01-15 NOTE — Progress Notes (Signed)
Physical Therapy Discharge Summary  Patient Details  Name: Jackson Howell MRN: [id number] Date of Birth: May 01, 1940  Today's Date: 01/15/2017 PT Individual Time: 0900-1000 PT Individual Time Calculation (min): 60 min    Patient has met 7 of 7 long term goals due to improved activity tolerance, improved balance, increased strength and improved coordination.  Patient to discharge at an ambulatory level Modified Independent.   Patient's care partner is independent to provide the necessary cognitive assistance at discharge.  Reasons goals not met: n/a  Recommendation:  Patient will benefit from ongoing skilled PT services in home health setting to continue to advance safe functional mobility, address ongoing impairments in balance, pain, strength, safety awareness, and minimize fall risk.  Equipment: rolling walker  Reasons for discharge: treatment goals met  Patient/family agrees with progress made and goals achieved: Yes  PT Discharge Precautions/Restrictions Precautions Precautions: Fall Restrictions Weight Bearing Restrictions: No Pain Pain Assessment Pain Assessment: No/denies pain Pain Score: 0-No pain Pain Type: Acute pain Pain Location: Leg (right thigh) Pain Orientation: Right Pain Onset: With Activity Pain Intervention(s): Medication (See eMAR);RN made aware Vision/Perception  Vision - Assessment Additional Comments: no visual deficits effecting mobility Perception Perception: Within Functional Limits  Cognition Orientation Level: Oriented X4 Sensation Sensation Light Touch: Appears Intact Proprioception: Appears Intact Coordination Heel Shin Test: good range; uncoordinated movements, not able to keep heel against shin during movement Motor  Motor Motor: Other (comment) Motor - Discharge Observations: generalized weakness   Mobility Bed Mobility Bed Mobility: Rolling Right;Rolling Left;Supine to Sit;Sit to Supine Rolling Right: 6: Modified  independent (Device/Increase time) Rolling Left: 6: Modified independent (Device/Increase time) Right Sidelying to Sit: 6: Modified independent (Device/Increase time) Left Sidelying to Sit: 6: Modified independent (Device/Increase time) Supine to Sit: 6: Modified independent (Device/Increase time) Sit to Supine: 6: Modified independent (Device/Increase time) Transfers Transfers: Yes Sit to Stand: 6: Modified independent (Device/Increase time) Stand to Sit: 6: Modified independent (Device/Increase time) Stand Pivot Transfers: 6: Modified independent (Device/Increase time) Locomotion  Ambulation Ambulation: Yes Ambulation/Gait Assistance: 6: Modified independent (Device/Increase time) Ambulation Distance (Feet): 150 Feet Assistive device: Rolling walker Gait Gait: Yes Gait Pattern: Within Functional Limits (with RW) Gait velocity: 3.2 ft/s Stairs / Additional Locomotion Stairs: Yes Stairs Assistance: 5: Supervision Stair Management Technique: Two rails Number of Stairs: 4 Height of Stairs: 6 Ramp: 6: Modified independent (Device) Curb: 5: Building services engineer Mobility: No  Trunk/Postural Assessment  Cervical Assessment Cervical Assessment:  (forward head) Thoracic Assessment Thoracic Assessment:  (mild kyphosis) Lumbar Assessment Lumbar Assessment:  (posterior pelvic tilt) Postural Control Postural Control: Within Functional Limits  Balance Balance Balance Assessed: Yes Standardized Balance Assessment Standardized Balance Assessment: Timed Up and Go Test Timed Up and Go Test TUG: Normal TUG Normal TUG (seconds): 15.8 Static Standing Balance Static Standing - Balance Support: Bilateral upper extremity supported Static Standing - Level of Assistance: 6: Modified independent (Device/Increase time) Dynamic Standing Balance Dynamic Standing - Balance Support: Bilateral upper extremity supported Dynamic Standing - Level of Assistance: 6: Modified  independent (Device/Increase time) Extremity Assessment  RUE Assessment RUE Assessment: Exceptions to [facility] RUE AROM (degrees) RUE Overall AROM Comments: Limited ROM in rt shoulder; shoulder flexion/abduction limited to ~80 deg RUE Strength RUE Overall Strength Comments: RUE overall weaker compared to LUE LUE Assessment LUE Assessment: Within Functional Limits RLE Assessment RLE Assessment: Exceptions to [facility] RLE Strength Right Hip Flexion: 3+/5 Right Hip ABduction: 4/5 Right Hip ADduction: 4/5 Right Knee Flexion: 4/5 Right Knee Extension: 4/5 Right Ankle Dorsiflexion:  3+/5 Right Ankle Plantar Flexion: 4-/5 LLE Assessment LLE Assessment: Exceptions to [facility] LLE Strength Left Hip Flexion: 3+/5 Left Hip ABduction: 4/5 Left Hip ADduction: 4/5 Left Knee Flexion: 4/5 Left Knee Extension: 4/5 Left Ankle Dorsiflexion: 4/5 Left Ankle Plantar Flexion: 4/5   Skilled Therapy Intervention  Pt received sitting up in chair, agreeable to therapy. Pt assessed as above. Pt mod I overall. Supervision for car transfers, cues for proper RW management. Pt mod I for making up apt bed. Pt ambulated 50 ft x 2 without AD and min guard. Pt limited by pain in R thigh (7/10 with activity). Pt TUG score indicates risk for falls. Results discussed with pt. Pt performed toe taps onto cone with no UE support and min assist to prevent LOB, 1 set x 20 reps & 1 set x 10 reps. Pt limited by thigh pain. 5 min on Nustep at L5. Left sitting up in chair, all needs in reach.   See Function Navigator for Current Functional Status.  Gwinda Passe, SPT 01/15/2017, 12:19 PM

## 2017-01-15 NOTE — Progress Notes (Signed)
[facility]     PROGRESS NOTE  Subjective/Complaints:  Pt seen laying in bed this AM.  He slept well overnight. He is still upset about not being able to urinate.   ROS: +Urinary retention. Denies CP, SOB, nausea, vomiting, diarrhea.  Objective: Vital Signs: Blood pressure (!) 143/75, pulse 83, temperature 98.4 F (36.9 C), temperature source Oral, resp. rate 20, height 6' (1.829 m), weight 76.2 kg (168 lb 1.3 oz), SpO2 98 %. No results found.  Recent Labs  01/13/17 1028  WBC 6.3  HGB 8.4*  HCT 26.0*  PLT 408*    Recent Labs  01/13/17 1028  NA 135  K 4.2  CL 104  GLUCOSE 115*  BUN 15  CREATININE 0.97  CALCIUM 9.0   CBG (last 3)  No results for input(s): GLUCAP in the last 72 hours.  Wt Readings from Last 3 Encounters:  01/15/17 76.2 kg (168 lb 1.3 oz)  01/08/17 75.8 kg (167 lb 1.7 oz)    Physical Exam:  BP (!) 143/75 (BP Location: Left Arm)   Pulse 83   Temp 98.4 F (36.9 C) (Oral)   Resp 20   Ht 6' (1.829 m)   Wt 76.2 kg (168 lb 1.3 oz)   SpO2 98%   BMI 22.80 kg/m  Constitutional: He appears well-developed and well-nourished. NAD.  HENT: Normocephalic and atraumatic.  Eyes: No discharge. EOM are normal.  Cardiovascular: RRR.  No JVD. Respiratory: Effort normal and breath sounds normal.  GI: Bowel sounds are normal. He exhibits no distension.  Musculoskeletal: He exhibits no edema or tenderness.   Neurological: He is alert and oriented.  Motor: RUE: Shoulder abduction 4/5, distally 4+/5 RLE: 4+/5 proximal to distal (improving) LUE/LLE: 5/5 proximal to distal  Skin: Skin is warm and dry.    Psychiatric: He has a normal mood and affect. His behavior is normal. Thought content normal.    Assessment/Plan: 1. 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 Physiatrist is providing close team supervision and 24 hour management of active medical problems listed below. Physiatrist and rehab team continue to assess barriers to discharge/monitor patient progress toward functional and medical goals.  Function:  Bathing Bathing position   Position: Shower  Bathing parts Body parts bathed by patient: Right arm, Left arm, Chest, Abdomen, Front perineal area, Buttocks, Right upper leg, Left upper leg, Right lower leg, Left lower leg, Back Body parts bathed by helper: Back  Bathing assist Assist Level: Assistive device Assistive Device Comment: Shower chair, grab bars, hand held shower    Upper Body Dressing/Undressing Upper body dressing   What is the patient wearing?: Pull over shirt/dress     Pull over shirt/dress - Perfomed by patient: Thread/unthread right sleeve, Thread/unthread left sleeve, Put head through opening, Pull shirt over trunk Pull over shirt/dress - Perfomed by helper: Pull shirt over trunk Button up shirt - Perfomed by patient: Button/unbutton shirt, Pull shirt around back, Thread/unthread left sleeve, Thread/unthread right sleeve (zipper)      Upper body assist Assist Level: Assistive device Assistive Device Comment: RW to obtain clothing    Lower Body Dressing/Undressing Lower body dressing   What is the patient wearing?: Underwear, Pants, Non-skid slipper socks Underwear - Performed by patient: Thread/unthread right underwear leg, Thread/unthread left underwear leg, Pull underwear up/down   Pants- Performed by patient: Thread/unthread right pants  leg, Thread/unthread left pants leg, Pull pants up/down Pants- Performed by helper: Fasten/unfasten pants Non-skid slipper socks- Performed by patient: Don/doff right sock, Don/doff left sock Non-skid slipper socks- Performed by helper: Don/doff right sock                  Lower body assist Assist for lower body dressing: Assistive device Assistive Device  Comment: RW for support    Toileting Toileting Toileting activity did not occur: No continent bowel/bladder event (pt requiring foley. No bm today) Toileting steps completed by patient: Adjust clothing prior to toileting   Toileting Assistive Devices: Grab bar or rail  Toileting assist Assist level: Supervision or verbal cues   Transfers Chair/bed transfer   Chair/bed transfer method: Ambulatory Chair/bed transfer assist level: No Help, no cues, assistive device, takes more than a reasonable amount of time Chair/bed transfer assistive device: Patent attorney     Max distance: 150 ft Assist level: No help, No cues, assistive device, takes more than a reasonable amount of time   Wheelchair   Type: Manual Max wheelchair distance: 100 Assist Level: Supervision or verbal cues  Cognition Comprehension Comprehension assist level: Follows complex conversation/direction with extra time/assistive device  Expression Expression assist level: Expresses complex ideas: With no assist  Social Interaction Social Interaction assist level: Interacts appropriately 90% of the time - Needs monitoring or encouragement for participation or interaction.  Problem Solving Problem solving assist level: Solves basic problems with no assist  Memory Memory assist level: More than reasonable amount of time    Medical Problem List and Plan: 1.  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   Cont CIR  2.  DVT Prophylaxis/Anticoagulation: Pharmaceutical: Lovenox 3. Pain Management: tylenol prn  4. Mood: LCSW to follow for evaluation and support.  5. Neuropsych: This patient is fully capable of making decisions on his own behalf. 6. Skin/Wound Care: Routine pressure relief measures 7. Fluids/Electrolytes/Nutrition: Monitor I/Os. Used to drink boost at home.  8. HTN:  Vitals:    01/14/17 2114 01/15/17 0623  BP: 120/74 (!) 143/75  Pulse: 88 83  Resp:  20  Temp:  98.4 F (36.9 C)  SpO2:  98%   Metoprolol 50 decreased to 12.5 BID on 9/14   Relatively controlled 9/19  Resume home amlodipine, losartan as needed. 9. Dysphagia: Resolved  Advanced to regular diet 10. Alcohol abuse: continue thiamine and folic acid. 11. Tobacco abuse: Continue nicotine patch.  12. Peripheral neuropathy: Resumed low dose gabapentin at bedtime (PTA 600 mg daily at bedtime) to help with insomnia. Monitor for symptoms as activity increases.  13. Urinary retention:   Ucx NG, repeat UA equivocal 9/18, Ucx E. Coli  Due to traumatic I/O caths, will d/c foley and start empiric macrobid, wait sensitivities  Urology consulted, await recs 14. Code status: Discussed-- Full Code.  15. Acute kidney injury: ?Resolved  Encourage fluid intake  Will continue to monitor due to fluids  16. Anemia of chronic illness?:   Hemoglobin 8.4 on 9/17  Continue to monitor 17. Bilateral pleural effusions  Per CXR, reviewed  Continue Lasix  Continue to monitor 18. Hypoalbuminemia  Supplement initiated on 9/13  LOS (Days) 7 A FACE TO FACE EVALUATION WAS PERFORMED  Jackson Howell Karis Juba 01/15/2017 1:29 PM

## 2017-01-16 ENCOUNTER — Encounter (HOSPITAL_COMMUNITY): Payer: Self-pay

## 2017-01-16 DIAGNOSIS — B952 Enterococcus as the cause of diseases classified elsewhere: Secondary | ICD-10-CM

## 2017-01-16 DIAGNOSIS — R319 Hematuria, unspecified: Secondary | ICD-10-CM

## 2017-01-16 DIAGNOSIS — R5381 Other malaise: Secondary | ICD-10-CM

## 2017-01-16 DIAGNOSIS — R310 Gross hematuria: Secondary | ICD-10-CM

## 2017-01-16 MED ORDER — FINASTERIDE 5 MG PO TABS: 5.0000 mg | ORAL_TABLET | Freq: Every day | ORAL | 0 refills | Status: AC

## 2017-01-16 MED ORDER — POTASSIUM CHLORIDE CRYS ER 20 MEQ PO TBCR: 20.0000 meq | EXTENDED_RELEASE_TABLET | Freq: Every day | ORAL | 0 refills | Status: AC

## 2017-01-16 MED ORDER — ZOLPIDEM TARTRATE 5 MG PO TABS: 5.0000 mg | ORAL_TABLET | Freq: Every evening | ORAL | 0 refills | Status: AC | PRN

## 2017-01-16 MED ORDER — AMOXICILLIN 250 MG PO CAPS
250.0000 mg | ORAL_CAPSULE | Freq: Three times a day (TID) | ORAL | Status: DC
  Administered 2017-01-16: 250 mg via ORAL
  Filled 2017-01-16 (×2): qty 1

## 2017-01-16 MED ORDER — METOPROLOL TARTRATE 25 MG PO TABS: 12.5000 mg | ORAL_TABLET | Freq: Two times a day (BID) | ORAL | 0 refills | Status: AC

## 2017-01-16 MED ORDER — TAMSULOSIN HCL 0.4 MG PO CAPS: 0.4000 mg | ORAL_CAPSULE | Freq: Every day | ORAL | 0 refills | Status: AC

## 2017-01-16 MED ORDER — NITROFURANTOIN MONOHYD MACRO 100 MG PO CAPS
100.0000 mg | ORAL_CAPSULE | Freq: Two times a day (BID) | ORAL | Status: DC
  Administered 2017-01-16: 100 mg via ORAL
  Filled 2017-01-16: qty 1

## 2017-01-16 MED ORDER — NITROFURANTOIN MONOHYD MACRO 100 MG PO CAPS: 100.0000 mg | ORAL_CAPSULE | Freq: Two times a day (BID) | ORAL | 0 refills | Status: AC

## 2017-01-16 MED ORDER — FOLIC ACID 1 MG PO TABS: 1.0000 mg | ORAL_TABLET | Freq: Every day | ORAL | 0 refills | Status: AC

## 2017-01-16 MED ORDER — NITROFURANTOIN MONOHYD MACRO 100 MG PO CAPS: 100.0000 mg | ORAL_CAPSULE | Freq: Two times a day (BID) | ORAL | Status: DC

## 2017-01-16 MED ORDER — FUROSEMIDE 20 MG PO TABS: 20.0000 mg | ORAL_TABLET | Freq: Every day | ORAL | 0 refills | Status: AC

## 2017-01-16 MED ORDER — GABAPENTIN 100 MG PO CAPS: 100.0000 mg | ORAL_CAPSULE | Freq: Every day | ORAL | 0 refills | Status: AC

## 2017-01-16 MED ORDER — PANTOPRAZOLE SODIUM 40 MG PO TBEC: 40.0000 mg | DELAYED_RELEASE_TABLET | Freq: Every day | ORAL | 0 refills | Status: AC

## 2017-01-16 MED ORDER — FLAVOXATE HCL 100 MG PO TABS: 100.0000 mg | ORAL_TABLET | Freq: Three times a day (TID) | ORAL | 0 refills | Status: AC | PRN

## 2017-01-16 NOTE — Progress Notes (Signed)
Received call from lab that UCS now showing VRE. Discussed antibiotic regimen with pharmacist who recommended utilizing Macrobid as patient's renal status was WNL (instead of high dose amoxicillin). Will change patient back to macrobid.

## 2017-01-16 NOTE — Progress Notes (Signed)
Social Work  Discharge Note  The overall goal for the admission was met for:   Discharge location: Yes - home with daughter and her family who can provide only intermittent support  Length of Stay: Yes - 8 days  Discharge activity level: Yes - modified independent  Home/community participation: Yes  Services provided included: MD, RD, PT, OT, SLP, RN, TR, Pharmacy and [place]: Medicare and Private Insurance: [place]  Follow-up services arranged: Home Health: Therapist, sports, PT, OT via [facility], DME: rolling walker and tub seat via [facility] and Patient/Family has no preference for HH/DME agencies  Comments (or additional information):  Patient/Family verbalized understanding of follow-up arrangements: Yes  Individual responsible for coordination of the follow-up plan: pt  Confirmed correct DME delivered: Jackson Howell 01/16/2017    Andres Bantz

## 2017-01-16 NOTE — Progress Notes (Signed)
Patient discharged to home with daughter. Discharge instructions given via pamela love PA. Foley emptied prior to discharge.

## 2017-01-16 NOTE — Discharge Summary (Signed)
Physician Discharge Summary  Patient ID: Jackson Howell MRN: [id number] DOB/AGE: Feb 24, 1941 76 y.o.  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  History of empyema, CVA, current smoker. EXAM: CHEST  2 VIEW COMPARISON:  Portable chest x-ray of December 26, 2016 FINDINGS: The lungs are well-expanded. There is subtle increased interstitial density bilaterally. There is no alveolar infiltrate. There is a trace of blunting of the costophrenic angles bilaterally. There is stable asymmetric pleural thickening greatest on the left which is stable. The heart is normal in size. The pulmonary vascularity is not engorged. There is calcification in the wall of the aortic arch. There are old rib deformities bilaterally. IMPRESSION: Mild chronic bronchitic changes, stable. No acute pneumonia nor pulmonary edema. Tiny bilateral pleural effusions which appear new. Thoracic aortic atherosclerosis. Electronically Signed   By: Jaysiah  [place] M.D.   On: 01/09/2017 10:48   Dg Esophagus  Result Date: 01/02/2017 CLINICAL DATA:  76 [facility] with dysphagia to solids. Dobhoff enteric feeding tube removed this morning. EXAM: ESOPHOGRAM/BARIUM SWALLOW TECHNIQUE: Single contrast examination was performed using  thin barium. FLUOROSCOPY TIME:  Fluoroscopy Time:  2 minutes 18 seconds Radiation Exposure Index (if provided by the fluoroscopic device): Number of Acquired Spot Images: 0 COMPARISON:  Portable chest  12/26/2016. FINDINGS: Due to limited mobility a single contrast study was undertaken and the patient tolerated this well. No obstruction to the forward flow of contrast throughout the esophagus and into the stomach. Normal esophageal course and contour. Small hiatal hernia suspected (series 2, image 19). Otherwise negative gastroesophageal junction. Occasional tertiary contractions in the distal esophagus. Decreased esophageal mobility in the proximal third. Intermittent coughing in throat clearing occurred. No overt aspiration was observed. A 12.5 mm barium tablet was administered. The patient had some difficulty with the oral phase of swallowing the tablet, but wants swallowed the tablet passed freely to the hiatal hernia. The tablet remained within the hiatal hernia for short time and then passed into the body of the stomach. No gastroesophageal reflux occurred. IMPRESSION: 1. Presbyesophagus and small gastric hiatal hernia. 2. Occasional coughing during the study. No overt aspiration was observed, but if this continues recommend speech pathology evaluation. 3. The patient initially had difficulty with the oral phase of swallowing a barium tablet, but once swallowed it passed freely to the stomach without delay. Electronically Signed   By: Odessa Fleming M.D.   On: 01/02/2017 11:44   Dg Chest Port 1 View  Result Date: 12/26/2016 CLINICAL DATA:  Pneumonia. EXAM: PORTABLE CHEST 1 VIEW COMPARISON:  None. FINDINGS: There is a feeding tube which terminates below today's film. No pneumothorax. There is apparent pleuroparenchymal thickening in the left apex, asymmetric to the right. This could be at least partially positional in nature. The lungs are otherwise clear. The heart size borderline. The hila and mediastinum are unremarkable. IMPRESSION: 1. Apparent asymmetric pleuroparenchymal thickening in the left apex. This could potentially be at least partially positional. If the patient has not had surgery in this region,  recommend a PA and  lateral non rotated chest x-ray. If the finding persists at that time, recommend a CT scan. 2. No other abnormalities identified. Electronically Signed   By: Gerome Sam III M.D   On: 12/26/2016 18:13    Labs:  Basic Metabolic Panel: BMP Latest Ref Rng & Units 01/13/2017 01/09/2017 01/05/2017  Glucose 65 - 99 mg/dL 161(W) 90 -  BUN 6 - 20 mg/dL 15 13 -  Creatinine 9.60 - 1.24 mg/dL 4.54 0.98 -  Sodium 119 - 145 mmol/L 135 136 -  Potassium 3.5 - 5.1 mmol/L 4.2 4.1 3.8  Chloride 101 - 111 mmol/L 104 105 -  CO2 22 - 32 mmol/L 22 24 -  Calcium 8.9 - 10.3 mg/dL 9.0 1.4(N) -    CBC: CBC Latest Ref Rng & Units 01/13/2017 01/09/2017 12/27/2016  WBC 4.0 - 10.5 K/uL 6.3 6.0 9.7  Hemoglobin 13.0 - 17.0 g/dL 8.2(N) 7.8(L) 7.9(L)  Hematocrit 39.0 - 52.0 % 26.0(L) 25.4(L) 25.2(L)  Platelets 150 - 400 K/uL 408(H) 462(H) 540(H)    CBG: No results for input(s): GLUCAP in the last 168 hours.  Brief HPI:   Jackson Howell is a 76 year old male with history of HTN, peripheral neuropathy, dysphagia s/p esophageal dilatation, ETOH abuse, recent PNA who was admitted to [facility] on 12/08/16 with low grade fever/chills, fatigue, multiple episodes of emesis and SOB due to empyema. Hospital course there complicated by septic shock with PEA arrest 12/10/16 with CPR X 17 minutes. Chest tube placed with drainage of 2 L of purulent material and he was treated with Zosyn thorough 9/8. Noted to have severe dysphagia with NPO, aphasia and lethargy/encephalopathic--EEG negative for seizures. MRI brai n done showing subacute infarct left corona radiata and possible right caudate nucleus.  He was transferred to [facility] on 12/26/16 and has been making steady progress. Lethargy has resolved, respiratory status improved and has been started on dysphagia 2, nectar liquids. Esophagogram negative for stricture, no aspiration noted and he was started on modified diet. Foley remains in place due to urinary  retention.  He continues to be debilitated with decline in mobility and ability to carryout ADL tasks. CIR recommended by rehab team.    Hospital Course: IZZAC ROCKETT was admitted to rehab 01/08/2017 for inpatient therapies to consist of PT, ST and OT at least three hours five days a week. Past admission physiatrist, therapy team and rehab RN have worked together to provide customized collaborative inpatient rehab. Respiratory status has been stable and endurance has improved. Blood pressures were monitored on bid basis and have been stable on low dose lasix and lopressor. Follow up lytes revealed that AKI has resolved. Potassium levels is stable on current dose Kdur.  MBS was performed for objective swallow evaluation showing primarily sensory based dysphagia without signs of aspiration and he was advanced regular textures with thin liquids with intermittent supervision. He has been tolerating this without difficulty, po intake has been good and has been afebrile during his stay.   Foley was discontinued on 9/13 and he was started on bladder training.  He continue to have difficulty voiding with high PVRs requiring I/O caths and was started on proscar to help with voiding function. He has required coude cath due to enlarged prostate and developed hematuria requiring foley replacement on 9/14.   Case discussed with GU who recommended keeping catheter in for at least a week to decrease irritation to prostate and to follow up in office for voiding trial. UA/UCS was repeated showing  UTI therefore foley was removed for 24 hours, he was started on antibiotics and foley replaced 9/20 due to ongoing retention with intermittent hematuria. Urine culture was positive for VRE and he was discharged on Macrobid to complete a week course of antibiotic regimen. Follow up CBC 9/17 showed H/H is improving and WBC is WNL.  He has made steady progress during his rehab stay and is modified independent at wheelchair level.  Supervision is recommended when ambulating due to impulsivity with poor safety awareness. He will continue to receive follow up HHPT, HHOT and HHRN by [facility] after discharge.     Rehab course: During patient's stay in rehab weekly team conferences were held to monitor patient's progress, set goals and discuss barriers to discharge. At admission, patient required min assist with ADL tasks and mobility. He displayed signs of mild dysphagia with cues needed to maintain aspiration precautions.  He has had improvement in activity tolerance, balance, postural control, as well as ability to compensate for deficits. He is able to complete ADL tasks at modified independent level. He is modified independent for transfers and requires supervision to ambulate 150' with RW. Family was instructed on safety concerns.     Disposition: home.   Diet: Regular  Special Instructions: 1. Drink plenty of fluids. NO ALCOHOL.  2. Contact urology if bloody urine or blood clots noted.  3. Perform foley care twice a day.  4.Needs to follow up with Primary care for post hospital follow up in the next couple of weeks.    Discharge Instructions    Ambulatory referral to Physical Medicine Rehab    Complete by:  As directed    1-2 weeks transitional care appt     Current Discharge Medication List    START taking these medications   Details  finasteride (PROSCAR) 5 MG tablet Take 1 tablet (5 mg total) by mouth daily. Qty: 30 tablet, Refills: 0    flavoxATE (URISPAS) 100 MG tablet Take 1 tablet (100 mg total) by mouth 3 (three) times daily as needed for bladder spasms. Qty: 30 tablet, Refills: 0    gabapentin (NEURONTIN) 100 MG capsule Take 1 capsule (100 mg total) by mouth at bedtime. Qty: 30 capsule, Refills: 0    nitrofurantoin, macrocrystal-monohydrate, (MACROBID) 100 MG capsule Take 1 capsule (100 mg total) by mouth every 12 (twelve) hours. Qty: 12 capsule, Refills: 0      CONTINUE these  medications which have CHANGED   Details  folic acid (FOLVITE) 1 MG tablet Take 1 tablet (1 mg total) by mouth daily. Qty: 30 tablet, Refills: 0    furosemide (LASIX) 20 MG tablet Take 1 tablet (20 mg total) by mouth daily. Qty: 30 tablet, Refills: 0    metoprolol tartrate (LOPRESSOR) 25 MG tablet Take 0.5 tablets (12.5 mg total) by mouth 2 (two) times daily. Qty: 30 tablet, Refills: 0    pantoprazole (PROTONIX) 40 MG tablet Take 1 tablet (40 mg total) by mouth daily. Qty: 30 tablet, Refills: 0    potassium chloride SA (K-DUR,KLOR-CON) 20 MEQ tablet Take 1 tablet (20 mEq total) by mouth daily. Qty: 30 tablet, Refills: 0    tamsulosin (FLOMAX) 0.4 MG CAPS capsule Take 1 capsule (0.4 mg total) by mouth at bedtime. Qty: 30 capsule, Refills: 0    zolpidem (AMBIEN) 5 MG tablet Take 1 tablet (5 mg total) by mouth at bedtime as needed for sleep. Qty: 15 tablet, Refills: 0      CONTINUE these medications which have  NOT CHANGED   Details  Multiple Vitamin (MULTIVITAMIN) tablet Take 1 tablet by mouth daily.    nicotine (NICODERM CQ - DOSED IN MG/24 HOURS) 14 mg/24hr patch Place 14 mg onto the skin daily.    thiamine 100 MG tablet Take 100 mg by mouth daily.      STOP taking these medications     enoxaparin (LOVENOX) 40 MG/0.4ML injection        Follow-up Information    Marcello Fennel, MD Follow up.   Specialty:  Physical Medicine and Rehabilitation Why:  office will call you with follow up appointment Contact information: [address] [place] [address] [phone]        Dr. Sherren Mocha Follow up on 01/21/2017.   Why:  @ 11:15 AM Contact information: [facility] [address] [place], [place]  [address] [phone]          Signed: Jacquelynn Cree 01/16/2017, 4:04 PM

## 2017-01-16 NOTE — Progress Notes (Signed)
[facility] PHYSICAL MEDICINE & REHABILITATION     PROGRESS NOTE  Subjective/Complaints:  Pt seen sitting up in his chair this AM.  He slept well overnight.  He is ready for discharge.  He continues to have concerns about being able to urinate, however, notes that he was able to spontaneously void 100cc yesterday  ROS: +Urinary retention. Denies CP, SOB, nausea, vomiting, diarrhea.  Objective: Vital Signs: Blood pressure 137/80, pulse 71, temperature 97.6 F (36.4 C), temperature source Oral, resp. rate 18, height 6' (1.829 m), weight 76.2 kg (168 lb 1.3 oz), SpO2 96 %. No results found. No results for input(s): WBC, HGB, HCT, PLT in the last 72 hours. No results for input(s): NA, K, CL, GLUCOSE, BUN, CREATININE, CALCIUM in the last 72 hours.  Invalid input(s): CO CBG (last 3)  No results for input(s): GLUCAP in the last 72 hours.  Wt Readings from Last 3 Encounters:  01/15/17 76.2 kg (168 lb 1.3 oz)  01/08/17 75.8 kg (167 lb 1.7 oz)    Physical Exam:  BP 137/80 (BP Location: Right Arm)   Pulse 71   Temp 97.6 F (36.4 C) (Oral)   Resp 18   Ht 6' (1.829 m)   Wt 76.2 kg (168 lb 1.3 oz)   SpO2 96%   BMI 22.80 kg/m  Constitutional: He appears well-developed and well-nourished. NAD.  HENT: Normocephalic and atraumatic.  Eyes: No discharge. EOM are normal.  Cardiovascular: RRR.  No JVD. Respiratory: Effort normal and breath sounds normal.  GI: Bowel sounds are normal. He exhibits no distension.  Musculoskeletal: He exhibits no edema or tenderness.   Neurological: He is alert and oriented.  Motor: RUE: Shoulder abduction 4/5, distally 4+/5 RLE: 4+/5 proximal to distal (unchanged) LUE/LLE: 5/5 proximal to distal  Skin: Skin is warm and dry.    Psychiatric: He has a normal mood and affect. His behavior is normal. Thought content normal.    Assessment/Plan: 1. 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 Physiatrist is providing close team supervision and 24 hour management of active medical problems listed below. Physiatrist and rehab team continue to assess barriers to discharge/monitor patient progress toward functional and medical goals.  Function:  Bathing Bathing position   Position: Shower  Bathing parts Body parts bathed by patient: Right arm, Left arm, Chest, Abdomen, Front perineal area, Buttocks, Right upper leg, Left upper leg, Right lower leg, Left lower leg, Back Body parts bathed by helper: Back  Bathing assist Assist Level: Assistive device Assistive Device Comment: Shower chair, grab bars, hand held shower    Upper Body Dressing/Undressing Upper body dressing   What is the patient wearing?: Pull over shirt/dress     Pull over shirt/dress - Perfomed by patient: Thread/unthread right sleeve, Thread/unthread left sleeve, Put head through opening, Pull shirt over trunk Pull over shirt/dress - Perfomed by helper: Pull shirt over trunk Button up shirt - Perfomed by patient: Button/unbutton shirt, Pull shirt around back, Thread/unthread left sleeve, Thread/unthread right sleeve (zipper)      Upper body assist Assist Level: Assistive device Assistive Device Comment: RW to obtain clothing    Lower Body Dressing/Undressing Lower body dressing   What is the patient wearing?: Underwear, Pants, Non-skid slipper socks Underwear - Performed by patient: Thread/unthread right underwear leg, Thread/unthread left underwear leg, Pull underwear up/down   Pants- Performed by patient: Thread/unthread right  pants leg, Thread/unthread left pants leg, Pull pants up/down Pants- Performed by helper: Fasten/unfasten pants Non-skid slipper socks- Performed by patient: Don/doff right sock, Don/doff left sock Non-skid slipper socks- Performed by helper: Don/doff right sock                  Lower  body assist Assist for lower body dressing: Assistive device Assistive Device Comment: RW for support    Financial trader activity did not occur: N/A (indwelling catheter, no bm) Toileting steps completed by patient: Adjust clothing prior to toileting   Toileting Assistive Devices: Grab bar or rail  Toileting assist Assist level: Supervision or verbal cues   Transfers Chair/bed transfer   Chair/bed transfer method: Ambulatory Chair/bed transfer assist level: No Help, no cues, assistive device, takes more than a reasonable amount of time Chair/bed transfer assistive device: Patent attorney     Max distance: 150 ft Assist level: No help, No cues, assistive device, takes more than a reasonable amount of time   Wheelchair   Type: Manual Max wheelchair distance: 100 Assist Level: Supervision or verbal cues  Cognition Comprehension Comprehension assist level: Follows complex conversation/direction with extra time/assistive device  Expression Expression assist level: Expresses complex ideas: With no assist  Social Interaction Social Interaction assist level: Interacts appropriately 90% of the time - Needs monitoring or encouragement for participation or interaction.  Problem Solving Problem solving assist level: Solves basic problems with no assist  Memory Memory assist level: More than reasonable amount of time    Medical Problem List and Plan: 1.  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   D/c today  Will see patient in 1-2 weeks for transitional care management 2.  DVT Prophylaxis/Anticoagulation: Pharmaceutical: Lovenox 3. Pain Management: tylenol prn  4. Mood: LCSW to follow for evaluation and support.  5. Neuropsych: This patient is fully capable of making decisions on his own behalf. 6. Skin/Wound Care: Routine pressure  relief measures 7. Fluids/Electrolytes/Nutrition: Monitor I/Os. Used to drink boost at home.  8. HTN:  Vitals:   01/15/17 2037 01/16/17 0429  BP: (!) 141/75 137/80  Pulse: 83 71  Resp:  18  Temp:  97.6 F (36.4 C)  SpO2:  96%   Metoprolol 50 decreased to 12.5 BID on 9/14   Relatively controlled 9/20  Resume home amlodipine, losartan as needed. 9. Dysphagia: Resolved  Advanced to regular diet 10. Alcohol abuse: continue thiamine and folic acid. 11. Tobacco abuse: Continue nicotine patch.  12. Peripheral neuropathy: Resumed low dose gabapentin at bedtime (PTA 600 mg daily at bedtime) to help with insomnia. Monitor for symptoms as activity increases.  13. Urinary retention:   Ucx NG, repeat UA equivocal 9/18, Ucx Enterococcus. Amoxacilline  Will replace foley due to bloody I/O caths  Urology as outpt 14. Code status: Discussed-- Full Code.  15. Acute kidney injury: ?Resolved  Encourage fluid intake  Will continue to monitor due to fluids  16. Anemia of chronic illness?:   Hemoglobin 8.4 on 9/17  Continue to monitor 17. Bilateral pleural effusions  Per CXR, reviewed  Continue Lasix  Continue to monitor 18. Hypoalbuminemia  Supplement initiated on 9/13  LOS (Days) 8 A FACE TO FACE EVALUATION WAS PERFORMED  Jackson Howell 01/16/2017 10:41 AM

## 2017-01-16 NOTE — Patient Care Conference (Signed)
Inpatient RehabilitationTeam Conference and Plan of Care Update Date: 01/15/2017   Time: 2:35 PM    Patient Name: Jackson Howell      Medical Record Number: [id number]  Date of Birth: 1940-12-07 Sex: Male         Room/Bed: 4W09C/4W09C-01 Payor Info: Payor: MEDICARE / Plan: MEDICARE PART A AND B / Product Type: *No Product type* /    Admitting Diagnosis: Encophalopathy  Admit Date/Time:  01/08/2017  7:14 PM Admission Comments: No comment available   Primary Diagnosis:  Debility Principal Problem: Debility  Patient Active Problem List   Diagnosis Date Noted  . Hematuria 01/16/2017  . Debility 01/16/2017  . Enterococcus UTI   . Hypoalbuminemia due to protein-calorie malnutrition ([place])   . Bilateral pleural effusion   . 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 Benign essential HTN   . Dysphagia   . ETOH abuse   . Tobacco abuse   . Peripheral polyneuropathy   . Urinary retention   . AKI (acute kidney injury) ([place])   . Anemia of chronic disease     Expected Discharge Date: Expected Discharge Date: 01/16/17  Team Members Present: Physician leading conference: Dr. 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   Improve mobility, urinary retention UTI  See above   Bowel/Bladder   continent of bowel, foley catheter placed 01/11/17. plan to go home with foley.  LBM 9/18  maintain b/b with mod I assist  monitor b/b q shift and prn   Swallow/Nutrition/ Hydration             ADL's   supervision  Mod I  safety with RW, ADL retraining   Mobility   as of 01/15/17- achieved goals  modified independent basic  transfers and gait x 150', supervision car transfer and up/down 1 step  pt ed, higher level balance, endurance, strengthening   Communication             Safety/Cognition/ Behavioral Observations            Pain   occasional c/o leg pain, bladder spasms, relieved with tylenol  pain </= 2 with min assist  monitor pain q shift and prn   Skin   L chest incision, redness to buttocks  maintain skin integrity with mod I assist  monitor q shift and prn    Rehab Goals Patient on target to meet rehab goals: Yes *See Care Plan and progress notes for long and short-term goals.     Barriers to Discharge  Current Status/Progress Possible Resolutions Date Resolved   Physician    Medical stability;Other (comments)  Urinary retention  See above  Therapies, abx for UTI, foley for Urinary retention      Nursing                  PT                    OT                  SLP                SW  Discharge Planning/Teaching Needs:  PLan home with daughter who can provide only intermittent assist  NA as pt with mod ind goals.   Team Discussion:  Has met all mod ind goals and ready for d/c tomorrow.  Still can be impulsive at times and over-estimate his abilities.    Revisions to Treatment Plan:  None    Continued Need for Acute Rehabilitation Level of Care: The patient requires daily medical management by a physician with specialized training in physical medicine and rehabilitation for the following conditions: Daily direction of a multidisciplinary physical rehabilitation program to ensure safe treatment while eliciting the highest outcome that is of practical value to the patient.: Yes Daily medical management of patient stability for increased activity during participation in an intensive rehabilitation regime.: Yes Daily analysis of laboratory values and/or radiology reports with any subsequent need for medication adjustment of medical intervention for : Other;Neurological  problems  Avrum Kimball 01/17/2017, 11:32 AM

## 2017-01-16 NOTE — Plan of Care (Signed)
Problem: RH BOWEL ELIMINATION Goal: RH STG MANAGE BOWEL WITH ASSISTANCE STG Manage Bowel with min Assistance.  Outcome: Progressing  01/16/17 1219  Bowel Management Goals  STG: Pt will manage bowels with assistance 4-Minimum assistance   Goal: RH STG MANAGE BOWEL W/MEDICATION W/ASSISTANCE STG Manage Bowel with Medication with min Assistance.  Outcome: Progressing  01/16/17 1219  Bowel Management Goals  STG: Pt will manage bowels with medication with assistance 4-Minimal assistance    Problem: RH BLADDER ELIMINATION Goal: RH STG MANAGE BLADDER WITH EQUIPMENT WITH ASSISTANCE STG Manage Bladder With Equipment With Mod I Assistance   Outcome: Not Progressing  01/16/17 1219  Bladder Management Goals  STG: Pt will manage bladder with equipment with assistance 1-Total assistance    Problem: RH SAFETY Goal: RH STG ADHERE TO SAFETY PRECAUTIONS W/ASSISTANCE/DEVICE STG Adhere to Safety Precautions With supervision  Assistance/Device.  Outcome: Progressing  01/16/17 1219  Safety Goals  STG:Pt will adhere to safety precautions with assistance/device 5-Supervision/set up   Goal: RH STG DECREASED RISK OF FALL WITH ASSISTANCE STG Decreased Risk of Fall With Mod I Assistance.   Outcome: Progressing  01/16/17 1219  Safety Goals  [id number] risk of fall with assistance/device 5-Supervison/set up    Problem: RH COGNITION-NURSING Goal: RH STG USES MEMORY AIDS/STRATEGIES W/ASSIST TO PROBLEM SOLVE STG Uses Memory Aids/Strategies With Assistance to min cues Problem Solve.  Outcome: Progressing  01/16/17 1219  Memory Goals  STG: Uses memory aids/strategies with assistance 4-Minimal assistance   Goal: RH STG ANTICIPATES NEEDS/CALLS FOR ASSIST W/ASSIST/CUES STG Anticipates Needs/Calls for Assist With Assistance/Cues.  Outcome: Progressing  01/16/17 1219  Memory Goals  STG: Anticipates needs/calls for assistance with assistance/cues 4-Minimal assistance

## 2017-01-16 NOTE — Discharge Instructions (Signed)
Inpatient Rehab Discharge Instructions  ZACHARI ALBERTA Discharge date and time: 01/16/17   Activities/Precautions/ Functional Status: Activity: no lifting, driving, or strenuous exercise till cleared by MD Diet: regular diet Wound Care: N/A  Functional status:  ___ No restrictions     ___ Walk up steps independently ___ 24/7 supervision/assistance   ___ Walk up steps with assistance _X__ Intermittent supervision/assistance  _X__ Bathe/dress independently ___ Walk with walker     ___ Bathe/dress with assistance ___ Walk Independently    ___ Shower independently _X__ Walk with supervision.     ___ Shower with assistance _X__ No alcohol     ___ Return to work/school ________   Special Instructions: 1. Contact urology for input if you notice any blood/blood clots in urine  2. Perform foley care twice a day. Keep foley bag off the floor /cover with pillowcase.  3.  Drink plenty of water to flush  Kidneys and keep hydrated.  4. Needs to follow up with Primary care for post hospital follow up in the next couple of weeks.    COMMUNITY REFERRALS UPON DISCHARGE:    Home Health:   PT     OT     RN                       Agency:  [facility]    Phone:  [phone]   Medical Equipment/Items Ordered:  Rolling walker, tub seat                                                      Agency/Supplier:  [facility] @ [phone]     My questions have been answered and I understand these instructions. I will adhere to these goals and the provided educational materials after my discharge from the hospital.  Patient/Caregiver Signature _______________________________ Date __________  Clinician Signature _______________________________________ Date __________  Please bring this form and your medication list with you to all your follow-up doctor's appointments.

## 2017-01-17 ENCOUNTER — Telehealth: Payer: Self-pay

## 2017-01-17 ENCOUNTER — Telehealth: Payer: Self-pay | Admitting: *Deleted

## 2017-01-17 LAB — URINE CULTURE: Culture: 100000 — AB

## 2017-01-17 NOTE — Telephone Encounter (Addendum)
Transitional Care call-1st attempt to reach Mr [facility] Howell answer.  Message left with appt info and request to call office.               01/20/17 4:24  2nd attempt to reach Mr [facility]ell. No answer. Message left to call office.    Appointment Friday 01/31/17 @ 2:20  Arrive at 2:00 to see Dr Allena Katz [address]

## 2017-01-17 NOTE — Telephone Encounter (Signed)
Shelby Mattocks nurse from [facility] has called stating patient will start [facility] OT on 01/29/2017. If there are any questions or concerns Kendal Hymen can be reached at [phone]

## 2017-01-21 NOTE — Telephone Encounter (Signed)
Third attempt to reach Mr [facility] Howell was unsuccessful and he has not returned calls to office which was requested with VM.  His appt will be changed to a hospital follow up.

## 2017-01-22 ENCOUNTER — Telehealth: Payer: Self-pay | Admitting: *Deleted

## 2017-01-22 NOTE — Telephone Encounter (Signed)
[facility], PT, [facility] left a message asking for verbal orders for HHPT to address, strengthening, balance and endurance, 2week8.  I returned the call and gave verbal orders per office protocol

## 2017-01-28 ENCOUNTER — Telehealth: Payer: Self-pay | Admitting: *Deleted

## 2017-01-28 NOTE — Telephone Encounter (Signed)
[facility] Loud, RN, [email or web address]  left amessage stating that patient is complaining about uncontrolled pain resulting from his reduction in gabapentin.  Patient claims he was reduced from 600 mg to 100 mg.  Home health nurse is also wanting to know if she should be irrigating his catheter.  If so she need a verbal on type of solution, how much solution and frequency.Marland KitchenMarland KitchenMarland KitchenMarland Kitchenplease advise

## 2017-01-28 NOTE — Telephone Encounter (Signed)
1. He is scheduled to see me in clinic on Friday.  I will discuss further med changes with him then.  Until that point, he may take . 2. His catheter should be followed by Urology, he was scheduled to follow up with Dr. Simona Huh on 9/25 with [facility].  Catheter questions should be deferred to Urology. Thanks.

## 2017-01-29 NOTE — Telephone Encounter (Signed)
Contacted nurse and left a message on secured voice mail.  Gave instructions per Dr. Allena Katz

## 2017-01-30 ENCOUNTER — Telehealth: Payer: Self-pay

## 2017-01-30 NOTE — Telephone Encounter (Signed)
Margarette, occupational therapist from home health called stating that patient has completed OT and does not need any additional therapy at this time. She has requested someone call her back for conformation.

## 2017-01-31 ENCOUNTER — Encounter: Payer: Medicare Other | Admitting: Physical Medicine & Rehabilitation

## 2017-01-31 NOTE — Telephone Encounter (Signed)
Spoke with occupational therapist and confirmed that patient no longer needs OT. Patient will continue with physical therapy

## 2017-02-19 ENCOUNTER — Telehealth: Payer: Self-pay | Admitting: Physical Medicine & Rehabilitation

## 2017-02-19 NOTE — Telephone Encounter (Signed)
Patient call txferred from [employer] voicemail (1:03pm)states [place] just [place] and has 3 message of follow-up including a letter.  He is livid that he has been with daughter and they we have her number and his cell phone and if we wanted to reach him there where he was doing his therapy.   I checked demographics home phone numbe,r sister, and friend number only showing on demographics - I added cell phone today 10.24.18.  He states our communication is unacceptable and he wants to file a complaint(we tried to call home phone 3 times to advise of hospital follow up and also mailed package).   He received all of this on 10.4.18 and called to cancel - I took phone call from patient at that time and noted that it was too short notice and patient didn't need appointment.  I will try to reach him this AM currently too early to place call.

## 2017-02-19 NOTE — Telephone Encounter (Signed)
Called [facility] Howell and advised we did not have daughter or cell phone nor knowledge he was rehabing elsewhere.  He said that was fine he was okay now other than Urinary issues.  I told him I would notify [facility] - he states not happy with him either  - does  Not feel like he was heard IP.   Told him I would advise MD of concern.

## 2018-01-14 IMAGING — RF DG ESOPHAGUS
9 of 12 series · 13 of 24 positions shown · non-contrast
Comparison: Portable chest 12/26/2016.

CLINICAL DATA: 75[facility]
inpatient with dysphagia to solids. Dobhoff enteric feeding tube
removed this morning.

EXAM:
ESOPHOGRAM/BARIUM SWALLOW
TECHNIQUE: Single contrast examination was performed using  thin barium.
FLUOROSCOPY TIME:  Fluoroscopy Time:  2 minutes 18 seconds
Radiation Exposure Index (if provided by the fluoroscopic device):
Number of Acquired Spot Images: 0

[Series 1: cp_standard · 0.34mm/px · 2 of 71 frames shown (1 of 9)]
[frame 11/71]
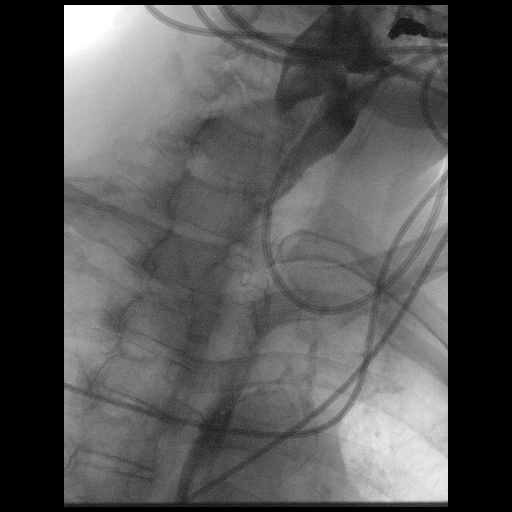
[frame 69/71]
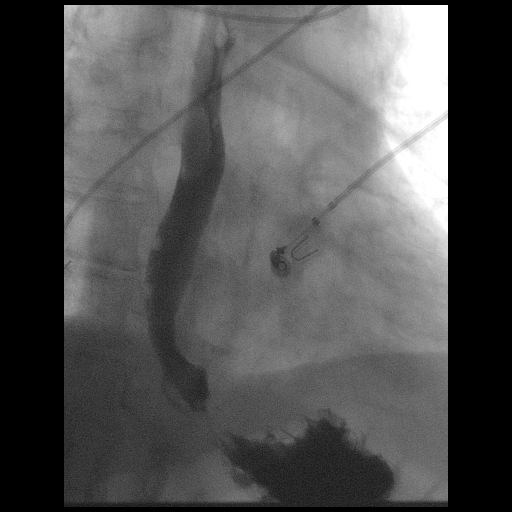

[Series 2: cp_standard · 0.34mm/px · 1 of 20 frames shown (2 of 9)]
[frame 16/20]
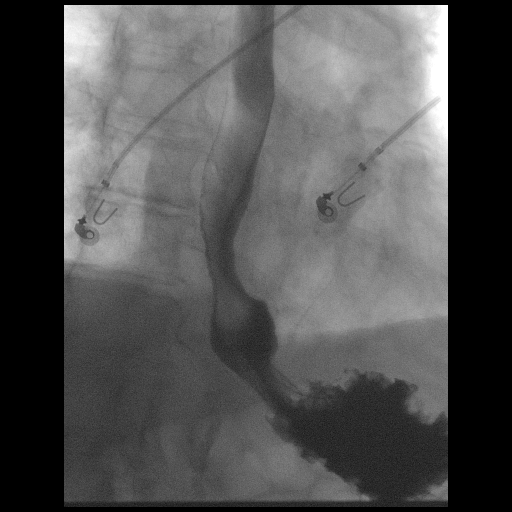

[Series 4: cp_standard · 0.34mm/px · 2 of 41 frames shown (3 of 9)]
[frame 7/41]
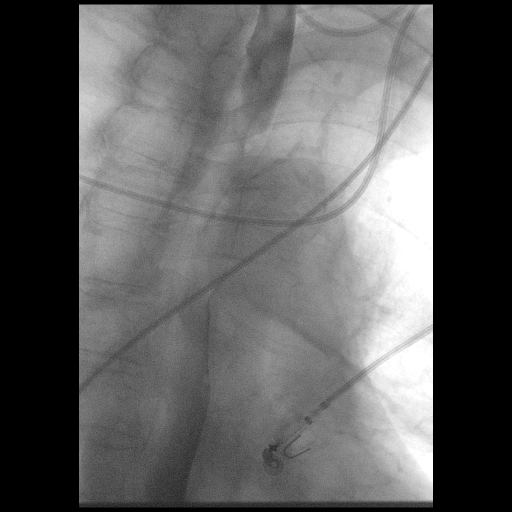
[frame 21/41]
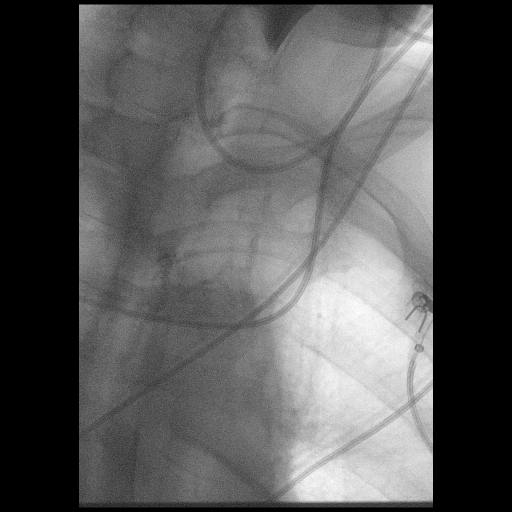

[Series 6: cp_standard · 0.34mm/px · 2 of 37 frames shown (4 of 9)]
[frame 6/37]
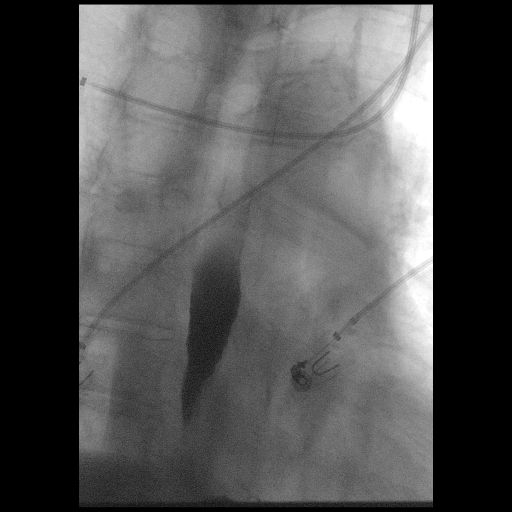
[frame 32/37]
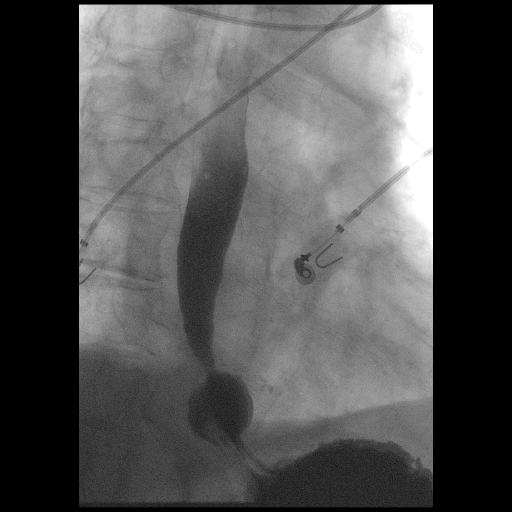

[Series 8: cp_standard · 0.17mm/px · 1 of 1 slices shown (5 of 9)]
[im 1/1]
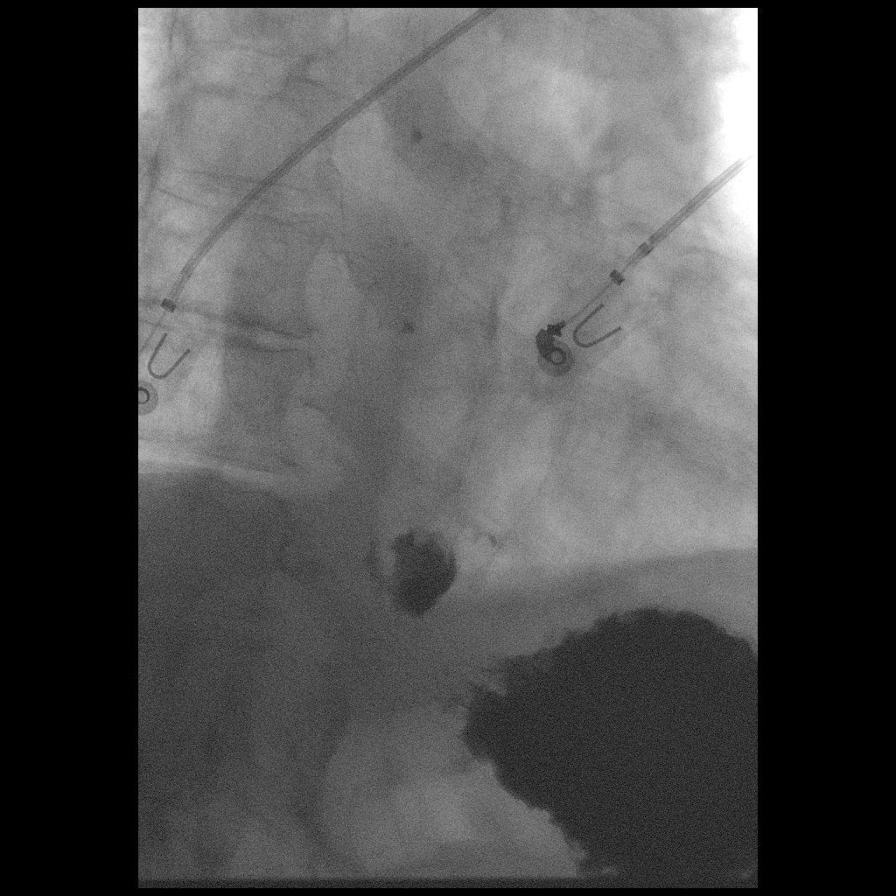

[Series 11: cp_standard · 0.17mm/px · 1 of 1 slices shown (6 of 9)]
[im 1/1]
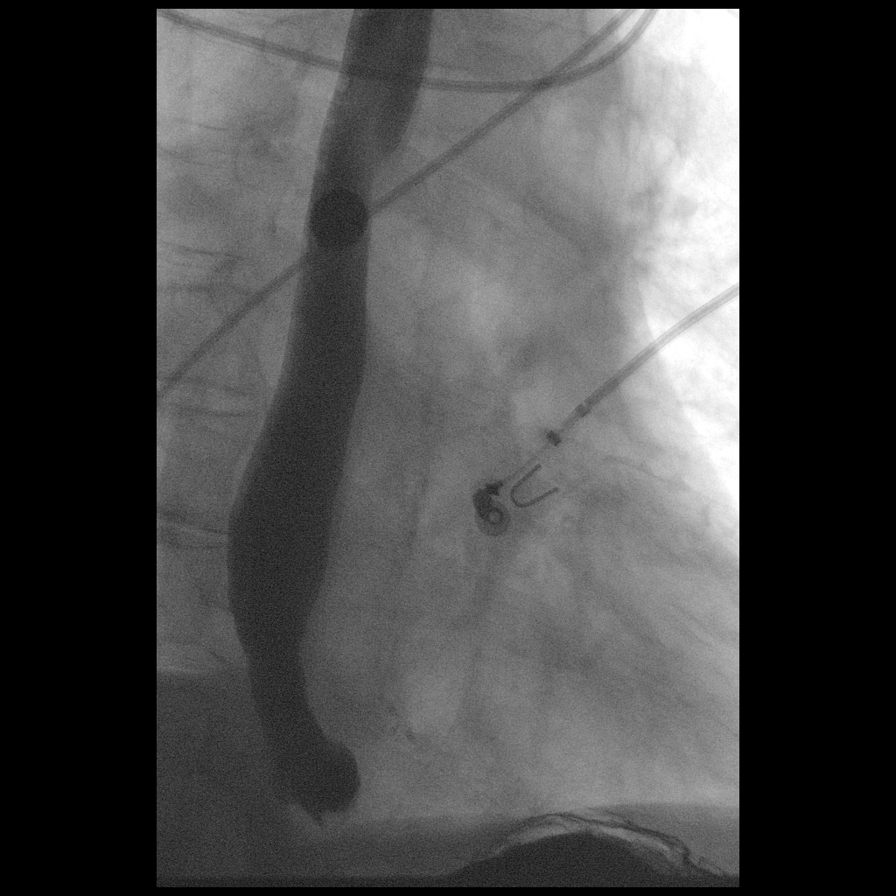

[Series 12: cp_standard · 0.34mm/px · 1 of 25 frames shown (7 of 9)]
[frame 13/25]
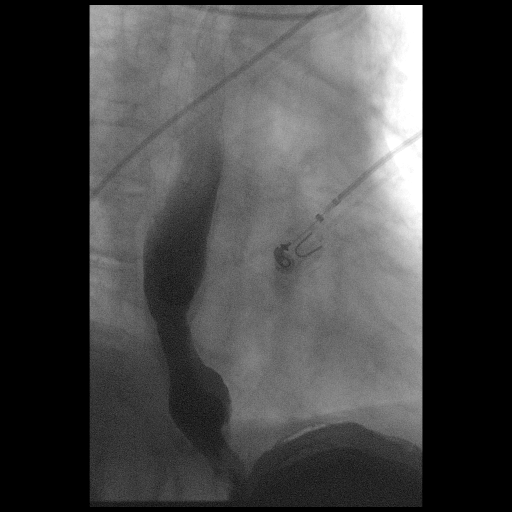

[Series 13: cp_standard · 0.17mm/px · 1 of 1 slices shown (8 of 9)]
[im 1/1]
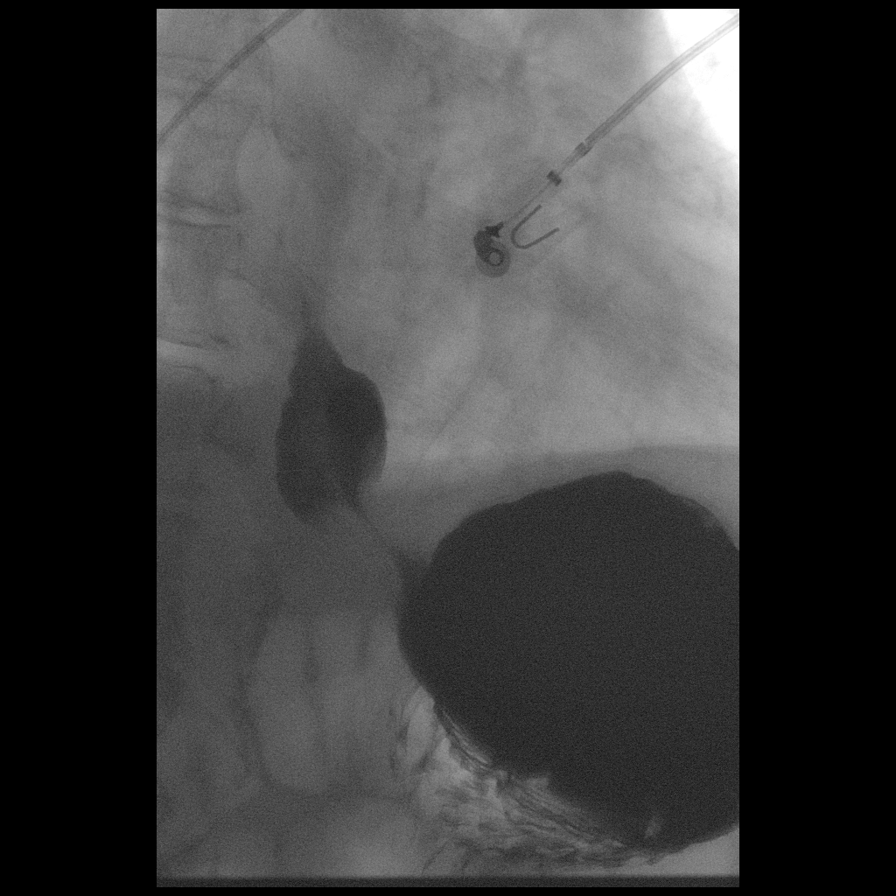

[Series 16: cp_standard · 0.34mm/px · 2 of 26 frames shown (9 of 9)]
[frame 4/26]
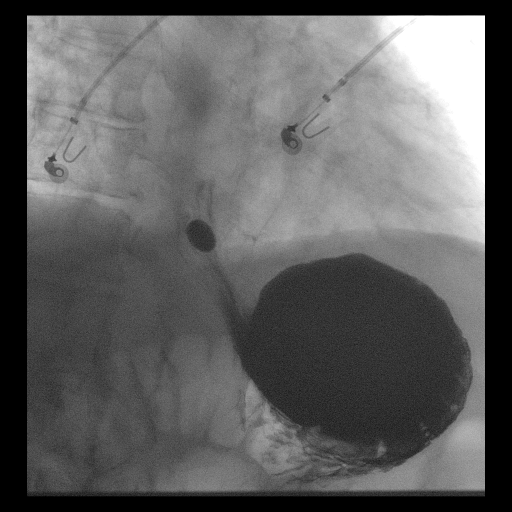
[frame 26/26]
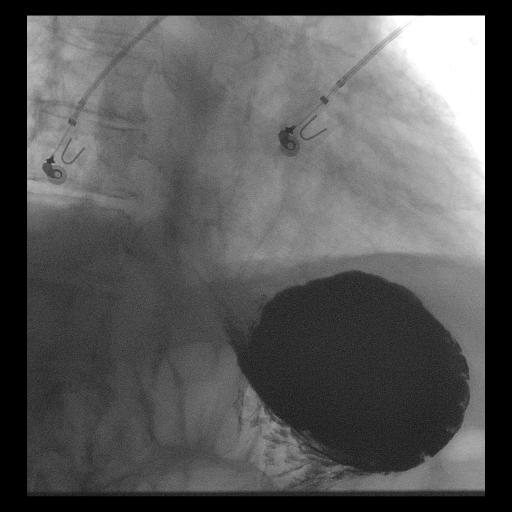

[13 of 24 positions shown; findings below may reference images not displayed]

FINDINGS: Due to limited mobility a single contrast study was undertaken and
the patient tolerated this well.

No obstruction to the forward flow of contrast throughout the
esophagus and into the stomach. Normal esophageal course and
contour.

Small hiatal hernia suspected (series 2, image 19). Otherwise
negative gastroesophageal junction.

Occasional tertiary contractions in the distal esophagus. Decreased
esophageal mobility in the proximal third.

Intermittent coughing in throat clearing occurred. No overt
aspiration was observed.

A 12.5 mm barium tablet was administered. The patient had some
difficulty with the oral phase of swallowing the tablet, but wants
swallowed the tablet passed freely to the hiatal hernia. The tablet
remained within the hiatal hernia for short time and then passed
into the body of the stomach.

No gastroesophageal reflux occurred.
IMPRESSION: 1. Presbyesophagus and small gastric hiatal hernia.
2. Occasional coughing during the study. No overt aspiration was
observed, but if this continues recommend speech pathology
evaluation.
3. The patient initially had difficulty with the oral phase of
swallowing a barium tablet, but once swallowed it passed freely to
the stomach without delay.

## 2021-03-29 DEATH — deceased
